# Patient Record
Sex: Male | Born: 1965 | Race: Black or African American | Hispanic: No | Marital: Married | Smoking: Never smoker
Health system: Southern US, Community
[De-identification: ages and names within clinical notes are randomized; demographics above are authoritative.]

## PROBLEM LIST (undated history)

## (undated) DIAGNOSIS — I10 Essential (primary) hypertension: Secondary | ICD-10-CM

---

## 2019-08-13 ENCOUNTER — Other Ambulatory Visit: Payer: Self-pay | Admitting: Sports Medicine

## 2019-08-13 DIAGNOSIS — R200 Anesthesia of skin: Secondary | ICD-10-CM

## 2019-08-13 DIAGNOSIS — M542 Cervicalgia: Secondary | ICD-10-CM

## 2019-08-13 DIAGNOSIS — M5030 Other cervical disc degeneration, unspecified cervical region: Secondary | ICD-10-CM

## 2019-08-18 ENCOUNTER — Other Ambulatory Visit: Payer: Self-pay

## 2019-08-18 ENCOUNTER — Ambulatory Visit
Admission: RE | Admit: 2019-08-18 | Discharge: 2019-08-18 | Disposition: A | Discharge status: Home / Self Care | Payer: Federal, State, Local not specified - PPO | Source: Ambulatory Visit | Attending: Sports Medicine | Admitting: Sports Medicine

## 2019-08-18 DIAGNOSIS — R200 Anesthesia of skin: Secondary | ICD-10-CM | POA: Diagnosis present

## 2019-08-18 DIAGNOSIS — R202 Paresthesia of skin: Secondary | ICD-10-CM | POA: Insufficient documentation

## 2019-08-18 DIAGNOSIS — M542 Cervicalgia: Secondary | ICD-10-CM | POA: Insufficient documentation

## 2019-08-18 DIAGNOSIS — M5030 Other cervical disc degeneration, unspecified cervical region: Secondary | ICD-10-CM | POA: Diagnosis present

## 2020-11-12 ENCOUNTER — Other Ambulatory Visit: Payer: Self-pay

## 2020-11-12 ENCOUNTER — Other Ambulatory Visit: Payer: Federal, State, Local not specified - PPO

## 2020-11-12 DIAGNOSIS — Z20822 Contact with and (suspected) exposure to covid-19: Secondary | ICD-10-CM

## 2020-11-13 LAB — SARS-COV-2, NAA 2 DAY TAT

## 2020-11-13 LAB — NOVEL CORONAVIRUS, NAA: SARS-CoV-2, NAA: NOT DETECTED

## 2021-06-03 IMAGING — MR MR CERVICAL SPINE W/O CM
5 series · 39 of 48 positions shown · non-contrast
Comparison: None.

CLINICAL DATA: Left-sided neck pain

EXAM:
MRI CERVICAL SPINE WITHOUT CONTRAST
TECHNIQUE: Multiplanar, multisequence MR imaging of the cervical spine was
performed. No intravenous contrast was administered.

[Series 5: T2 · sagittal · 3.0mm · 0.62mm/px · 6 of 15 slices shown (1 of 2)]
[im 1/15]
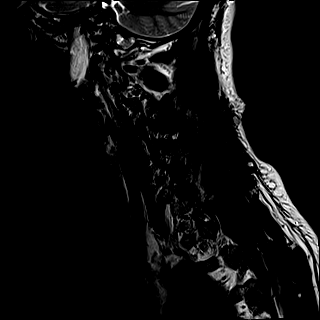
[im 3/15]
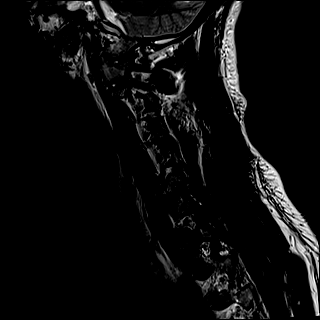
[im 6/15]
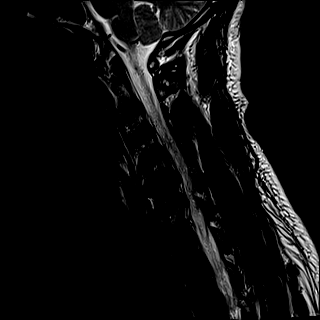
[im 9/15]
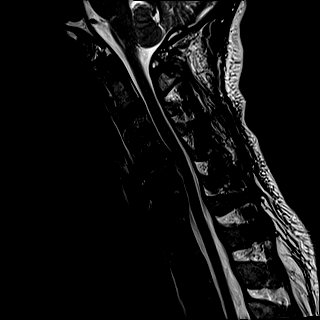
[im 12/15]
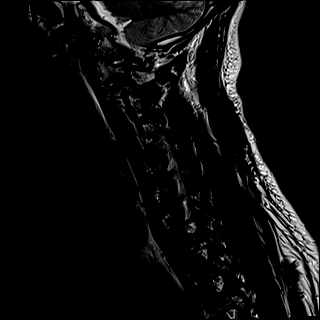
[im 15/15]
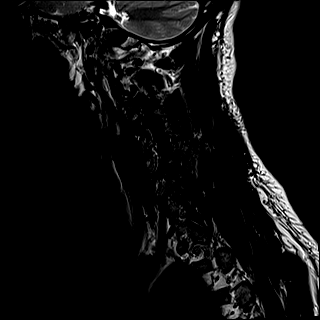

[Series 6: FLAIR · sagittal · 3.0mm · 0.78mm/px · 7 of 15 slices shown]
[im 1/15]
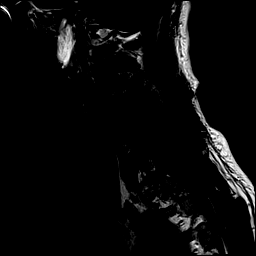
[im 3/15]
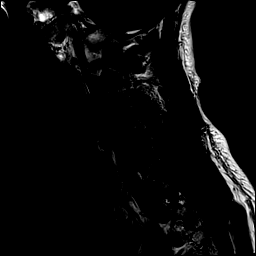
[im 5/15]
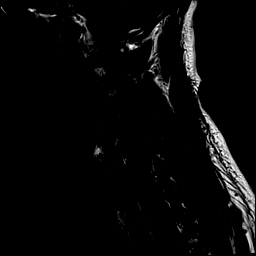
[im 8/15]
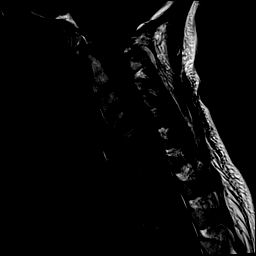
[im 10/15]
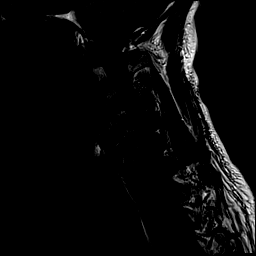
[im 12/15]
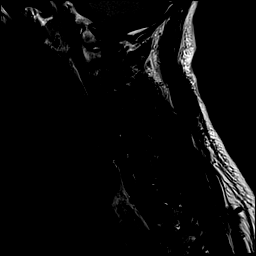
[im 15/15]
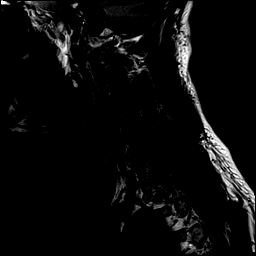

[Series 7: STIR · sagittal · 3.0mm · 0.62mm/px · 7 of 15 slices shown]
[im 1/15]
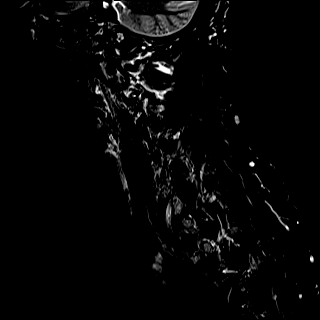
[im 3/15]
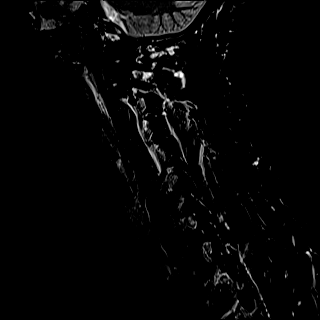
[im 5/15]
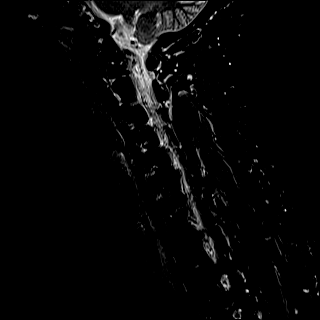
[im 8/15]
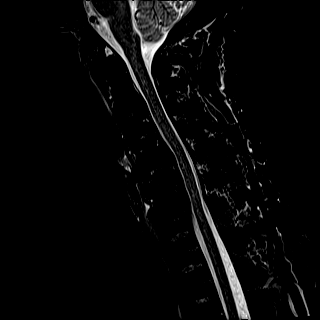
[im 10/15]
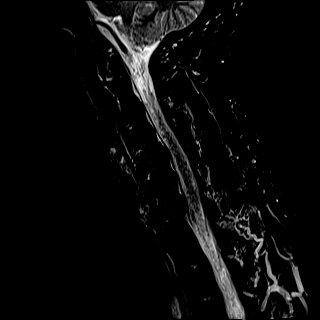
[im 12/15]
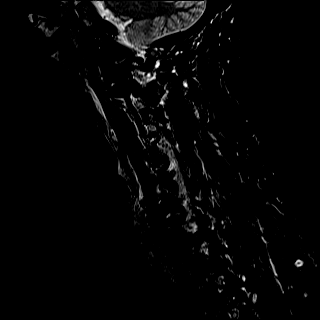
[im 15/15]
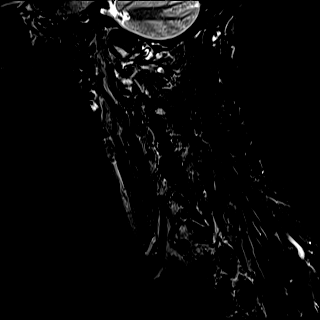

[Series 8: T2 · axial · 3.0mm · 0.70mm/px · z∈[-143,-51]mm · 11 of 29 slices shown (2 of 2)]
[im 1/29]
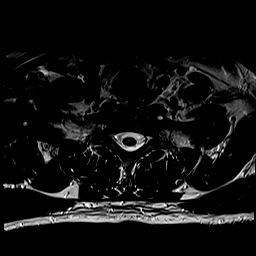
[im 3/29]
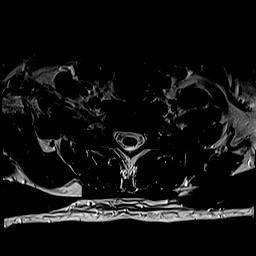
[im 5/29]
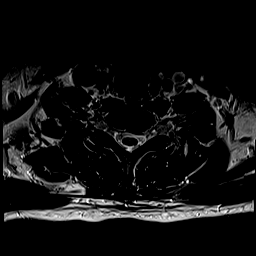
[im 7/29]
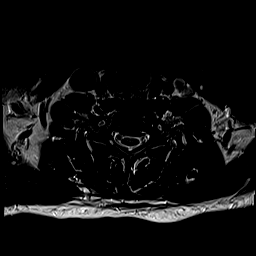
[im 9/29]
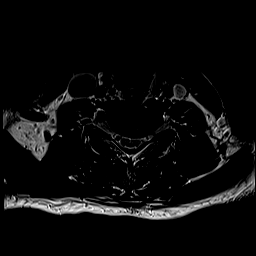
[im 11/29]
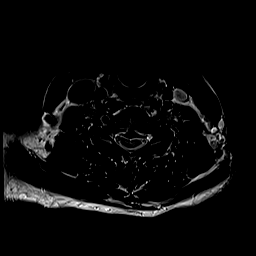
[im 13/29]
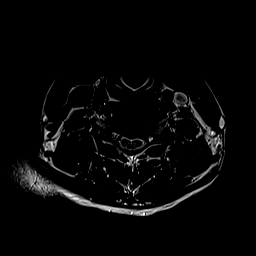
[im 16/29]
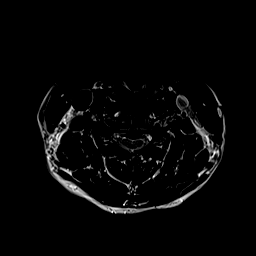
[im 20/29]
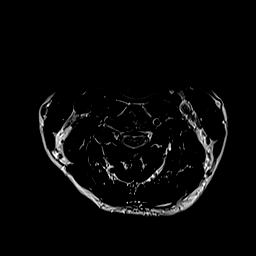
[im 24/29]
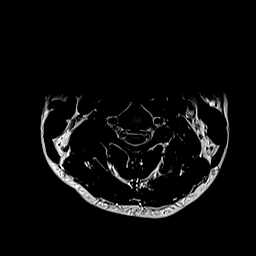
[im 29/29]
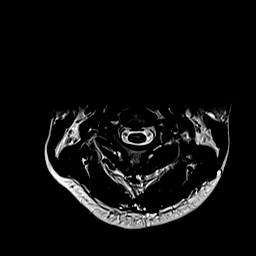

[Series 9: ax mpgr · axial · 3.0mm · 0.35mm/px · z∈[-143,-51]mm · 8 of 29 slices shown]
[im 1/29]
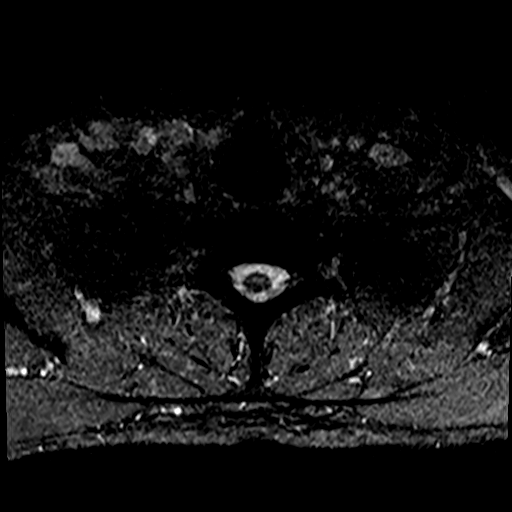
[im 5/29]
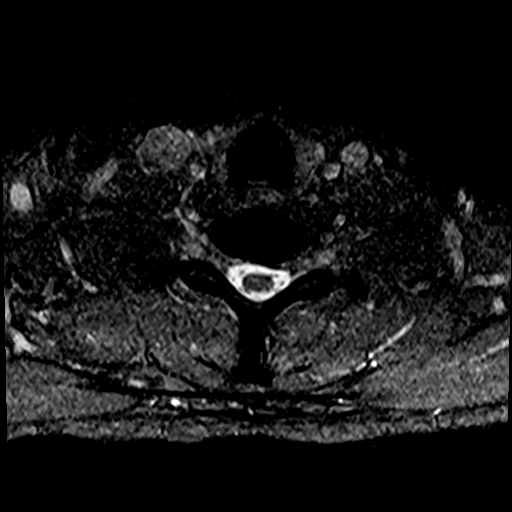
[im 9/29]
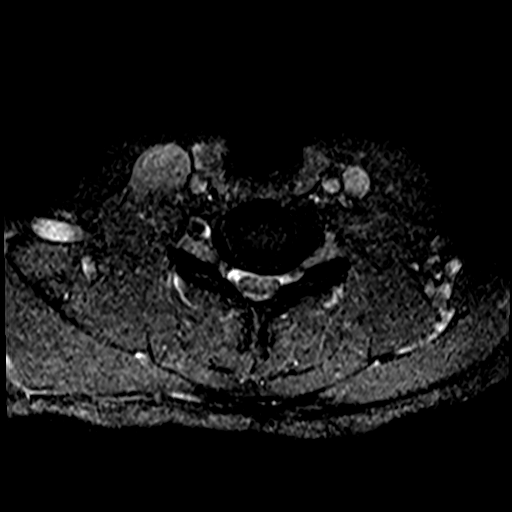
[im 13/29]
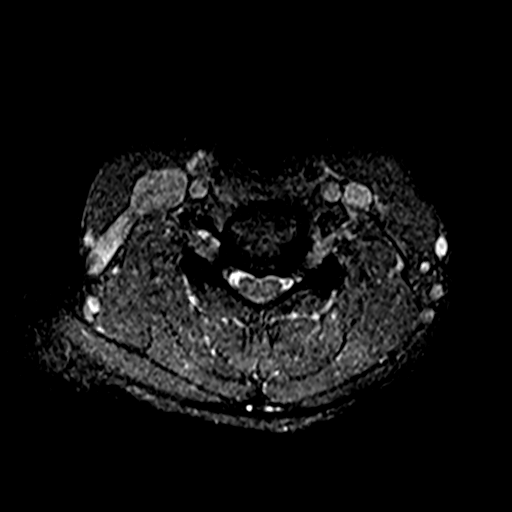
[im 16/29]
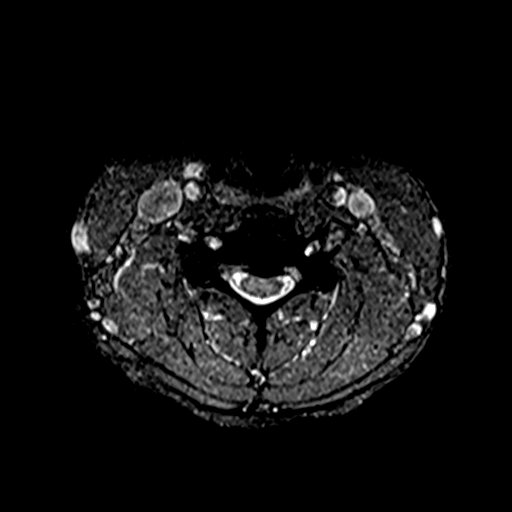
[im 20/29]
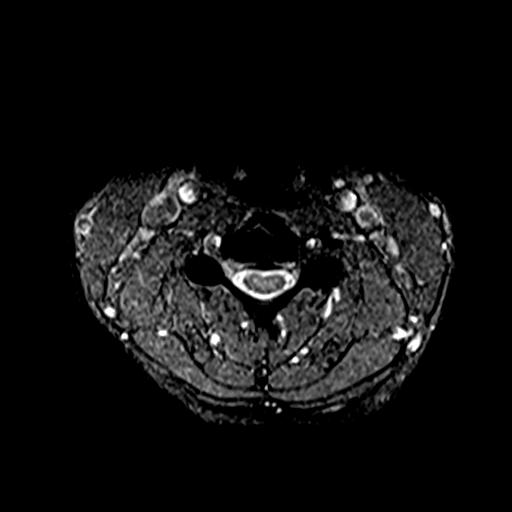
[im 24/29]
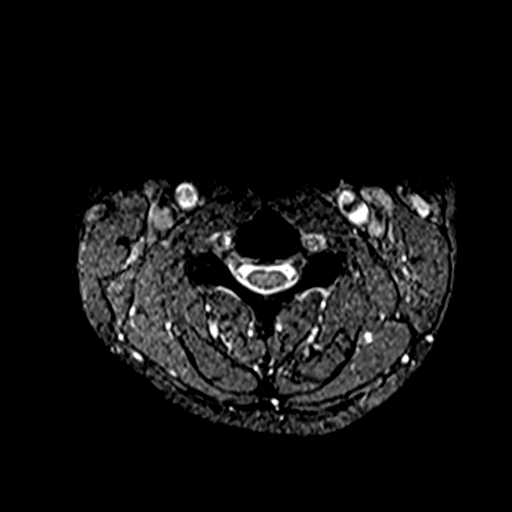
[im 29/29]
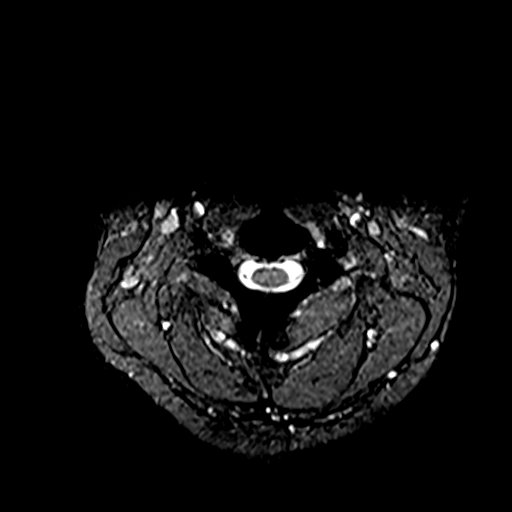

[39 of 48 positions shown; findings below may reference images not displayed]

FINDINGS: Alignment: Straightening of the cervical lordosis. No static
listhesis.

Vertebrae: No fracture, evidence of discitis, or bone lesion.
Multilevel discogenic endplate marrow changes and anterior endplate
spurring.

Cord: Normal signal and morphology.

Posterior Fossa, vertebral arteries, paraspinal tissues: Negative.

Disc levels:

C2-C3: Small posterior disc osteophyte complex and mild facet
arthrosis. There is mild impress upon the ventral thecal sac without
canal stenosis. Bilateral foramina are patent.

C3-C4: No significant disc protrusion, foraminal stenosis, or canal
stenosis.

C4-C5: Mild posterior disc osteophyte complex with mild bilateral
facet arthrosis. There is mild canal stenosis without significant
foraminal stenosis.

C5-C6: Posterior disc osteophyte complex and mild bilateral facet
and uncovertebral arthropathy resulting in mild canal stenosis and
mild left foraminal stenosis.

C6-C7: Posterior disc osteophyte complex and mild bilateral facet
arthrosis resulting in mild canal stenosis and mild left foraminal
stenosis.

C7-T1: No significant disc protrusion, foraminal stenosis, or canal
stenosis.
IMPRESSION: 1. Mild cervical spondylosis from C2 through C7 with mild canal
stenosis at C4-5 through C6-C7.
2. Mild left foraminal stenosis at C5-6 and C6-7.

## 2022-01-03 DIAGNOSIS — R079 Chest pain, unspecified: Secondary | ICD-10-CM | POA: Diagnosis not present

## 2022-01-03 DIAGNOSIS — R059 Cough, unspecified: Secondary | ICD-10-CM | POA: Diagnosis not present

## 2022-01-03 DIAGNOSIS — J40 Bronchitis, not specified as acute or chronic: Secondary | ICD-10-CM | POA: Diagnosis not present

## 2022-01-03 DIAGNOSIS — R0989 Other specified symptoms and signs involving the circulatory and respiratory systems: Secondary | ICD-10-CM | POA: Diagnosis not present

## 2022-01-07 ENCOUNTER — Other Ambulatory Visit: Payer: Self-pay

## 2022-01-07 ENCOUNTER — Encounter: Payer: Self-pay | Admitting: Nurse Practitioner

## 2022-01-07 ENCOUNTER — Ambulatory Visit: Payer: Federal, State, Local not specified - PPO | Admitting: Nurse Practitioner

## 2022-01-07 ENCOUNTER — Telehealth: Payer: Self-pay

## 2022-01-07 VITALS — BP 160/100 | HR 76 | Temp 98.5°F | Ht 68.25 in | Wt 172.4 lb

## 2022-01-07 DIAGNOSIS — N529 Male erectile dysfunction, unspecified: Secondary | ICD-10-CM

## 2022-01-07 DIAGNOSIS — M4802 Spinal stenosis, cervical region: Secondary | ICD-10-CM | POA: Insufficient documentation

## 2022-01-07 DIAGNOSIS — Z23 Encounter for immunization: Secondary | ICD-10-CM | POA: Diagnosis not present

## 2022-01-07 DIAGNOSIS — I10 Essential (primary) hypertension: Secondary | ICD-10-CM

## 2022-01-07 DIAGNOSIS — M25362 Other instability, left knee: Secondary | ICD-10-CM

## 2022-01-07 DIAGNOSIS — M19012 Primary osteoarthritis, left shoulder: Secondary | ICD-10-CM

## 2022-01-07 LAB — CBC
HCT: 38.9 % — ABNORMAL LOW (ref 39.0–52.0)
Hemoglobin: 12.8 g/dL — ABNORMAL LOW (ref 13.0–17.0)
MCHC: 33 g/dL (ref 30.0–36.0)
MCV: 82.7 fl (ref 78.0–100.0)
Platelets: 341 10*3/uL (ref 150.0–400.0)
RBC: 4.7 Mil/uL (ref 4.22–5.81)
RDW: 13.4 % (ref 11.5–15.5)
WBC: 19.4 10*3/uL (ref 4.0–10.5)

## 2022-01-07 LAB — BASIC METABOLIC PANEL
BUN: 14 mg/dL (ref 6–23)
CO2: 32 mEq/L (ref 19–32)
Calcium: 9.2 mg/dL (ref 8.4–10.5)
Chloride: 103 mEq/L (ref 96–112)
Creatinine, Ser: 0.99 mg/dL (ref 0.40–1.50)
GFR: 84.81 mL/min (ref >60.00)
Glucose, Bld: 88 mg/dL (ref 70–99)
Potassium: 3.8 mEq/L (ref 3.5–5.1)
Sodium: 140 mEq/L (ref 135–145)

## 2022-01-07 LAB — TSH: TSH: 0.41 u[IU]/mL (ref 0.35–5.50)

## 2022-01-07 LAB — PSA: PSA: 0.2 ng/mL (ref 0.10–4.00)

## 2022-01-07 MED ORDER — CYCLOBENZAPRINE HCL 5 MG PO TABS: 5.0000 mg | ORAL_TABLET | Freq: Every evening | ORAL | 5 refills | Status: DC | PRN

## 2022-01-07 MED ORDER — LOSARTAN POTASSIUM 25 MG PO TABS: 25.0000 mg | ORAL_TABLET | Freq: Every day | ORAL | 1 refills | Status: DC

## 2022-01-07 MED ORDER — MELOXICAM 7.5 MG PO TABS: 7.5000 mg | ORAL_TABLET | Freq: Every day | ORAL | 5 refills | Status: DC

## 2022-01-07 NOTE — Telephone Encounter (Signed)
[facility]:  Curtis Bowman 05/06/1966  ? ?CRITICAL VALUE: WBC  19.4   ? ?

## 2022-01-07 NOTE — Assessment & Plan Note (Addendum)
Chronic, onset several years ago, worsening, interferes with sleep. ?Left side radiates to left arm, has left arm weakness ?Denies any previous neck injury or surgery. ?Works as a [employer] certified accountant. ?Evaluate in past by [facility]. Treated with intramuscular injection and outpatient PT x 2weeks. No improvement. ?Reviewed previous cervical spine X-ray and MRI 2020: Mild cervical spondylosis from C2 through C7 with mild canal stenosis at C4-5 through C6-C7. Mild left foraminal stenosis at C5-6 and C6-7. ?Current use of Aleve 3tabs 3x/week with minimal improvement. ? ?Ref to ortho ?Start meloxicam and flexeril ?Advised about the risk of drowsiness with flexeril. He is to take med only at bedtime if needed ?

## 2022-01-07 NOTE — Progress Notes (Signed)
? ?             Established Patient Visit ? ?Patient: Curtis Bowman   DOB: July 21, 1966   56 y.o. Male  MRN: [id number] ?Visit Date: 01/07/2022 ? ?Subjective:  ?  ?Chief Complaint  ?Patient presents with  ? Establish Care  ?  Pt would like to discuss ED and neck pain also.  ?Flu and shingles vaccine given today. ?Tdap done within the past 10 years patient will get the date.   ? ?HPI ?Cervical stenosis of spinal canal ?Chronic, onset several years ago, worsening, interferes with sleep. ?Left side radiates to left arm, has left arm weakness ?Denies any previous neck injury or surgery. ?Works as a Chartered certified accountant. ?Evaluate in past by [facility]. Treated with intramuscular injection and outpatient PT x 2weeks. No improvement. ?Reviewed previous cervical spine X-ray and MRI 2020: Mild cervical spondylosis from C2 through C7 with mild canal stenosis at C4-5 through C6-C7. Mild left foraminal stenosis at C5-6 and C6-7. ?Current use of Aleve 3tabs 3x/week with minimal improvement. ? ?Ref to ortho ?Start meloxicam and flexeril ?Advised about the risk of drowsiness with flexeril. He is to take med only at bedtime if needed ? ?Knee instability, left ?Knee injury at age 65: dislocated joint. ?experience intermittent swelling and joint instability with sudden movement, and prolonged standing or walking. ?Minimal improvement with knee sleeve. ? ?Start meloxicam ?Use knee sleeve daily, off at bedtime ?Ref to ortho ? ?Erectile dysfunction ?Onset 29yrs ago, erections are not as strong and do not last. ?Nocturia 2x/night. No frequency or hesitancy or hematuria or dysuria ?No Fhx of  BPH or prostate cancer ?No personal hx of DM ?No tobacco use ?ETOH and marijuana use daily ? previous use of Viagra (caused headache) and cialis (effective with no adverse side effects) ? ?Elevated BP today: improve BP management before resuming cialis ?Advised to decrease ETOH use to 1beer per day and quit marijuana use ?Check cbc, TSH, BMP,  testosterone and PSA. ? ? ?Primary hypertension ?Noted in 2020 during Ortho visit ?Check BP twice today and still elevate. ?No tobacco use ?ETOH and marijuana use daily ?BP Readings from Last 3 Encounters:  ?01/07/22 (!) 160/100  ? ?Start losartan 25mg  ?Check cbc, tsh and bmp. ?Advised about the complications of uncontrolled HTN, including ED. Also advised about the correlation with ETOH and marijuana use.  ?Provided information on DASH diet ?F/up in 71month ? ?Reviewed medical, surgical, and social history today ? ?Medications: ?Outpatient Medications Prior to Visit  ?Medication Sig  ? doxycycline (VIBRA-TABS) 100 MG tablet Take by mouth.  ? predniSONE (STERAPRED UNI-PAK 21 TAB) 10 MG (21) TBPK tablet Take by mouth.  ? promethazine-dextromethorphan (PROMETHAZINE-DM) 6.25-15 MG/5ML syrup Take 5 mLs by mouth every 6 (six) hours as needed.  ? [DISCONTINUED] methocarbamol (ROBAXIN) 500 MG tablet Take by mouth.  ? ?No facility-administered medications prior to visit.  ? ?Reviewed past medical and social history.  ? ?ROS per HPI above ? ? ?   ?Objective:  ?BP (!) 160/100 (BP Location: Left Arm, Patient Position: Supine)   Pulse 76   Temp 98.5 ?F (36.9 ?C) (Temporal)   Ht 5' 8.25" (1.734 m)   Wt 172 lb 6.4 oz (78.2 kg)   SpO2 97%   BMI 26.02 kg/m?  ? ?  ? ?Physical Exam ?Vitals reviewed.  ?Neck:  ?   Thyroid: No thyroid mass, thyromegaly or thyroid tenderness.  ?Cardiovascular:  ?   Rate and Rhythm: Normal rate and regular rhythm.  ?  Pulses: Normal pulses.  ?   Heart sounds: Normal heart sounds.  ?Pulmonary:  ?   Effort: Pulmonary effort is normal.  ?   Breath sounds: Normal breath sounds.  ?Musculoskeletal:     ?   General: Tenderness present. Normal range of motion.  ?   Right shoulder: Normal.  ?   Left shoulder: Tenderness, bony tenderness and crepitus present. No swelling, deformity, effusion or laceration. Normal range of motion. Decreased strength. Normal pulse.  ?   Right upper arm: Normal.  ?   Left upper  arm: Normal.  ?   Right elbow: Normal.  ?   Left elbow: Normal.  ?   Right forearm: Normal.  ?   Left forearm: Normal.  ?   Right hand: Normal. Normal pulse.  ?   Left hand: Decreased strength. Normal pulse.  ?   Cervical back: No spasms, torticollis, tenderness, bony tenderness or crepitus. Pain with movement present. Normal range of motion.  ?   Right lower leg: No edema.  ?   Left lower leg: No edema.  ?Lymphadenopathy:  ?   Cervical: No cervical adenopathy.  ?Neurological:  ?   Mental Status: He is alert.  ?  ?No results found for any visits on 01/07/22. ?   ?Assessment & Plan:  ?  ?Problem List Items Addressed This Visit   ? ?  ? Cardiovascular and Mediastinum  ? Primary hypertension - Primary  ?  Noted in 2020 during Ortho visit ?Check BP twice today and still elevate. ?No tobacco use ?ETOH and marijuana use daily ?BP Readings from Last 3 Encounters:  ?01/07/22 (!) 160/100  ? ?Start losartan 25mg  ?Check cbc, tsh and bmp. ?Advised about the complications of uncontrolled HTN, including ED. Also advised about the correlation with ETOH and marijuana use.  ?Provided information on DASH diet ?F/up in 47month ?  ?  ? Relevant Medications  ? losartan (COZAAR) 25 MG tablet  ? Other Relevant Orders  ? CBC  ? Basic metabolic panel  ? TSH  ?  ? Musculoskeletal and Integument  ? Primary osteoarthritis of left shoulder  ? Relevant Medications  ? predniSONE (STERAPRED UNI-PAK 21 TAB) 10 MG (21) TBPK tablet  ? meloxicam (MOBIC) 7.5 MG tablet  ? cyclobenzaprine (FLEXERIL) 5 MG tablet  ? Other Relevant Orders  ? Ambulatory referral to Physical Therapy  ? AMB referral to orthopedics  ?  ? Other  ? Cervical stenosis of spinal canal  ?  Chronic, onset several years ago, worsening, interferes with sleep. ?Left side radiates to left arm, has left arm weakness ?Denies any previous neck injury or surgery. ?Works as a Chartered certified accountantmachinist. ?[facility]. Treated with intramuscular injection and outpatient PT x  2weeks. No improvement. ?Reviewed previous cervical spine X-ray and MRI 2020: Mild cervical spondylosis from C2 through C7 with mild canal stenosis at C4-5 through C6-C7. Mild left foraminal stenosis at C5-6 and C6-7. ?Current use of Aleve 3tabs 3x/week with minimal improvement. ? ?Ref to ortho ?Start meloxicam and flexeril ?Advised about the risk of drowsiness with flexeril. He is to take med only at bedtime if needed ?  ?  ? Relevant Medications  ? meloxicam (MOBIC) 7.5 MG tablet  ? cyclobenzaprine (FLEXERIL) 5 MG tablet  ? Other Relevant Orders  ? Ambulatory referral to Physical Therapy  ? AMB referral to orthopedics  ? Erectile dysfunction  ?  Onset 3867yrs ago, erections are not as strong and do not last. ?Nocturia  2x/night. No frequency or hesitancy or hematuria or dysuria ?No Fhx of  BPH or prostate cancer ?No personal hx of DM ?No tobacco use ?ETOH and marijuana use daily ? previous use of Viagra (caused headache) and cialis (effective with no adverse side effects) ? ?Elevated BP today: improve BP management before resuming cialis ?Advised to decrease ETOH use to 1beer per day and quit marijuana use ?Check cbc, TSH, BMP, testosterone and PSA. ? ?  ?  ? Relevant Orders  ? CBC  ? Basic metabolic panel  ? TSH  ? PSA  ? Testosterone,Free and Total  ? Knee instability, left  ?  Knee injury at age 35: dislocated joint. ?experience intermittent swelling and joint instability with sudden movement, and prolonged standing or walking. ?Minimal improvement with knee sleeve. ? ?Start meloxicam ?Use knee sleeve daily, off at bedtime ?Ref to ortho ?  ?  ? Relevant Orders  ? AMB referral to orthopedics  ? ?Other Visit Diagnoses   ? ? Flu vaccine need      ? Relevant Orders  ? Flu Vaccine QUAD 6+ mos PF IM (Fluarix Quad PF) (Completed)  ? Need for shingles vaccine      ? Relevant Orders  ? Varicella-zoster vaccine IM (Completed)  ? ?  ? ?I have spent with this patient regarding history taking, documentation, review of  radiology, specialty note, formulating plan and discussing treatment options with patient.  ? ?Return in about 4 weeks (around 02/04/2022) for HTN. ? ?  ? ?Alysia Penna, NP ? ? ? ?

## 2022-01-07 NOTE — Assessment & Plan Note (Addendum)
Noted in 2020 during Ortho visit ?Check BP twice today and still elevate. ?No tobacco use ?ETOH and marijuana use daily ?BP Readings from Last 3 Encounters:  ?01/07/22 (!) 160/100  ? ?Start losartan 25mg  ?Check cbc, tsh and bmp. ?Advised about the complications of uncontrolled HTN, including ED. Also advised about the correlation with ETOH and marijuana use.  ?Provided information on DASH diet ?F/up in 85month ?

## 2022-01-07 NOTE — Patient Instructions (Addendum)
Thank you for choosing [facility] ? ?Start losartan, mobic, and flexeril as prescribed ? ?You will be contacted to schedule appt with ortho and for PT. ? ?Go to lab for blood draw. ? ?Hypertension, Adult ?Hypertension is another name for high blood pressure. High blood pressure forces your heart to work harder to pump blood. This can cause problems over time. ?There are two numbers in a blood pressure reading. There is a top number (systolic) over a bottom number (diastolic). It is best to have a blood pressure that is below 120/80. Healthy choices can help lower your blood pressure, or you may need medicine to help lower it. ?What are the causes? ?The cause of this condition is not known. Some conditions may be related to high blood pressure. ?What increases the risk? ?Smoking. ?Having type 2 diabetes mellitus, high cholesterol, or both. ?Not getting enough exercise or physical activity. ?Being overweight. ?Having too much fat, sugar, calories, or salt (sodium) in your diet. ?Drinking too much alcohol. ?Having long-term (chronic) kidney disease. ?Having a family history of high blood pressure. ?Age. Risk increases with age. ?Race. You may be at higher risk if you are African American. ?Gender. Men are at higher risk than women before age 58. After age 18, women are at higher risk than men. ?Having obstructive sleep apnea. ?Stress. ?What are the signs or symptoms? ?High blood pressure may not cause symptoms. Very high blood pressure (hypertensive crisis) may cause: ?Headache. ?Feelings of worry or nervousness (anxiety). ?Shortness of breath. ?Nosebleed. ?A feeling of being sick to your stomach (nausea). ?Throwing up (vomiting). ?Changes in how you see. ?Very bad chest pain. ?Seizures. ?How is this treated? ?This condition is treated by making healthy lifestyle changes, such as: ?Eating healthy foods. ?Exercising more. ?Drinking less alcohol. ?Your health care provider may prescribe medicine if lifestyle  changes are not enough to get your blood pressure under control, and if: ?Your top number is above 130. ?Your bottom number is above 80. ?Your personal target blood pressure may vary. ?Follow these instructions at home: ?Eating and drinking ? ?If told, follow the DASH eating plan. To follow this plan: ?Fill one half of your plate at each meal with fruits and vegetables. ?Fill one fourth of your plate at each meal with whole grains. Whole grains include whole-wheat pasta, brown rice, and whole-grain bread. ?Eat or drink low-fat dairy products, such as skim milk or low-fat yogurt. ?Fill one fourth of your plate at each meal with low-fat (lean) proteins. Low-fat proteins include fish, chicken without skin, eggs, beans, and tofu. ?Avoid fatty meat, cured and processed meat, or chicken with skin. ?Avoid pre-made or processed food. ?Eat less than 1,500 mg of salt each day. ?Do not drink alcohol if: ?Your doctor tells you not to drink. ?You are pregnant, may be pregnant, or are planning to become pregnant. ?If you drink alcohol: ?Limit how much you use to: ?0-1 drink a day for women. ?0-2 drinks a day for men. ?Be aware of how much alcohol is in your drink. In the [place] one drink equals one 12 oz bottle of beer (355 mL), one 5 oz glass of wine (148 mL), or one 1? oz glass of hard liquor (44 mL). ?Lifestyle ? ?Work with your doctor to stay at a healthy weight or to lose weight. Ask your doctor what the best weight is for you. ?Get at least 30 minutes of exercise most days of the week. This may include walking, swimming, or biking. ?Get at  least 30 minutes of exercise that strengthens your muscles (resistance exercise) at least 3 days a week. This may include lifting weights or doing Pilates. ?Do not use any products that contain nicotine or tobacco, such as cigarettes, e-cigarettes, and chewing tobacco. If you need help quitting, ask your doctor. ?Check your blood pressure at home as told by your doctor. ?Keep all follow-up  visits as told by your doctor. This is important. ?Medicines ?Take over-the-counter and prescription medicines only as told by your doctor. Follow directions carefully. ?Do not skip doses of blood pressure medicine. The medicine does not work as well if you skip doses. Skipping doses also puts you at risk for problems. ?Ask your doctor about side effects or reactions to medicines that you should watch for. ?Contact a doctor if you: ?Think you are having a reaction to the medicine you are taking. ?Have headaches that keep coming back (recurring). ?Feel dizzy. ?Have swelling in your ankles. ?Have trouble with your vision. ?Get help right away if you: ?Get a very bad headache. ?Start to feel mixed up (confused). ?Feel weak or numb. ?Feel faint. ?Have very bad pain in your: ?Chest. ?Belly (abdomen). ?Throw up more than once. ?Have trouble breathing. ?Summary ?Hypertension is another name for high blood pressure. ?High blood pressure forces your heart to work harder to pump blood. ?For most people, a normal blood pressure is less than 120/80. ?Making healthy choices can help lower blood pressure. If your blood pressure does not get lower with healthy choices, you may need to take medicine. ?This information is not intended to replace advice given to you by your health care provider. Make sure you discuss any questions you have with your health care provider. ?Document Revised: 06/20/2018 Document Reviewed: 06/20/2018 ?Elsevier Patient Education ? [employer]. ? ? ?Cooking With Less Salt ?Cooking with less salt is one way to reduce the amount of sodium you get from food. Sodium is one of the elements that make up salt. It is found naturally in foods and is also added to certain foods. Depending on your condition and overall health, your health care provider or dietitian may recommend that you reduce your sodium intake. Most people should have less than 2,300 milligrams (mg) of sodium each day. If you have high blood  pressure (hypertension), you may need to limit your sodium to 1,500 mg each day. Follow the tips below to help reduce your sodium intake. ?What are tips for eating less sodium? ?Reading food labels ? ?Check the food label before buying or using packaged ingredients. Always check the label for the serving size and sodium content. ?Look for products with no more than 140 mg of sodium in one serving. ?Check the % Daily Value column to see what percent of the daily recommended amount of sodium is provided in one serving of the product. Foods with 5% or less in this column are considered low in sodium. Foods with 20% or higher are considered high in sodium. ?Do not choose foods with salt as one of the first three ingredients on the ingredients list. If salt is one of the first three ingredients, it usually means the item is high in sodium. ?Shopping ?Buy sodium-free or low-sodium products. Look for the following words on food labels: ?Low-sodium. ?Sodium-free. ?Reduced-sodium. ?No salt added. ?Unsalted. ?Always check the sodium content even if foods are labeled as low-sodium or no salt added. ?Buy fresh foods. ?Cooking ?Use herbs, seasonings without salt, and spices as substitutes for salt. ?Use sodium-free  baking soda when baking. ?Grill, braise, or roast foods to add flavor with less salt. ?Avoid adding salt to pasta, rice, or hot cereals. ?Drain and rinse canned vegetables, beans, and meat before use. ?Avoid adding salt when cooking sweets and desserts. ?Cook with low-sodium ingredients. ?What foods are high in sodium? ?Vegetables ?Regular canned vegetables (not low-sodium or reduced-sodium). Sauerkraut, pickled vegetables, and relishes. Olives. [place] fries. Onion rings. Regular canned tomato sauce and paste. Regular tomato and vegetable juice. Frozen vegetables in sauces. ?Grains ?Instant hot cereals. Bread stuffing, pancake, and biscuit mixes. Croutons. Seasoned rice or pasta mixes. Noodle soup cups. Boxed or  frozen macaroni and cheese. Regular salted crackers. Self-rising flour. Rolls. Bagels. Flour tortillas and wraps. ?Meats and other proteins ?Meat or fish that is salted, canned, smoked, cured, spiced, or p

## 2022-01-07 NOTE — Assessment & Plan Note (Addendum)
Onset 76yrs ago, erections are not as strong and do not last. ?Nocturia 2x/night. No frequency or hesitancy or hematuria or dysuria ?No Fhx of  BPH or prostate cancer ?No personal hx of DM ?No tobacco use ?ETOH and marijuana use daily ? previous use of Viagra (caused headache) and cialis (effective with no adverse side effects) ? ?Elevated BP today: improve BP management before resuming cialis ?Advised to decrease ETOH use to 1beer per day and quit marijuana use ?Check cbc, TSH, BMP, testosterone and PSA. ? ?

## 2022-01-07 NOTE — Assessment & Plan Note (Signed)
Knee injury at age 56: dislocated joint. ?experience intermittent swelling and joint instability with sudden movement, and prolonged standing or walking. ?Minimal improvement with knee sleeve. ? ?Start meloxicam ?Use knee sleeve daily, off at bedtime ?Ref to ortho ?

## 2022-01-14 LAB — TESTOSTERONE,FREE AND TOTAL
Testosterone, Free: 2.9 pg/mL — ABNORMAL LOW (ref 7.2–24.0)
Testosterone: 470 ng/dL (ref 264–916)

## 2022-01-20 ENCOUNTER — Ambulatory Visit: Payer: Federal, State, Local not specified - PPO | Admitting: Orthopaedic Surgery

## 2022-01-26 ENCOUNTER — Encounter: Payer: Self-pay | Admitting: Orthopaedic Surgery

## 2022-01-26 ENCOUNTER — Ambulatory Visit (INDEPENDENT_AMBULATORY_CARE_PROVIDER_SITE_OTHER): Payer: Federal, State, Local not specified - PPO

## 2022-01-26 ENCOUNTER — Ambulatory Visit: Payer: Federal, State, Local not specified - PPO | Admitting: Orthopaedic Surgery

## 2022-01-26 DIAGNOSIS — M4802 Spinal stenosis, cervical region: Secondary | ICD-10-CM

## 2022-01-26 DIAGNOSIS — M1712 Unilateral primary osteoarthritis, left knee: Secondary | ICD-10-CM

## 2022-01-26 MED ORDER — TRAMADOL HCL 50 MG PO TABS: 50.0000 mg | ORAL_TABLET | Freq: Two times a day (BID) | ORAL | 2 refills | Status: DC | PRN

## 2022-01-26 NOTE — Progress Notes (Signed)
? ?Office Visit Note ?  ?Patient: Curtis Bowman           ?Date of Birth: 21-Nov-1965           ?MRN: [id number] ?Visit Date: 01/26/2022 ?             ?Requested by: Anne Ng, NP ?[phone] ?[place],  [place] [address] ?PCP: Anne Ng, NP ? ? ?Assessment & Plan: ?Visit Diagnoses:  ?1. Cervical stenosis of spinal canal   ?2. Primary osteoarthritis of left knee   ? ? ?Plan: Impression is cervical spine radiculopathy left upper extremity and chronic left knee pain concerning for medial meniscal pathology.  In regards to the neck, I would like to refer him to Dr. Alvester Morin for epidural steroid injection.  In regards to the knee, proceeded with left knee cortisone injection today.  If his symptoms do not improve he will follow-up for recheck.  Call with concerns or questions. ? ?Follow-Up Instructions: Return if symptoms worsen or fail to improve.  ? ?Orders:  ?Orders Placed This Encounter  ?Procedures  ? XR Cervical Spine 2 or 3 views  ? XR KNEE 3 VIEW LEFT  ? ?Meds ordered this encounter  ?Medications  ? traMADol (ULTRAM) 50 MG tablet  ?  Sig: Take 1 tablet (50 mg total) by mouth every 12 (twelve) hours as needed.  ?  Dispense:  30 tablet  ?  Refill:  2  ? ? ? ? Procedures: ?No procedures performed ? ? ?Clinical Data: ?No additional findings. ? ? ?Subjective: ?Chief Complaint  ?Patient presents with  ? Left Shoulder - Pain  ? Neck - Pain  ? Left Knee - Pain  ? ? ?HPI patient is a pleasant 56 year old gentleman who comes in today with left-sided neck and arm pain in addition to left knee pain.  He notes that his left neck/arm pain began back in 2005 after sleeping awkwardly.  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  The pain he has is constant.  It is located to the lateral neck and radiates down the entire left arm and into his fingers.  He notes occasional paresthesias to the left hand.  Symptoms do appear to be worse with neck extension,  flexion as well as when he is lifting his left arm.  He recently been taking meloxicam with minimal relief.  No previous ESI.  He has been to physical therapy without relief.  Regards to his left knee, he has a history of what sounds like a valgus injury nearly 40 years ago.  He was seen by an orthopedist who recommended surgery at that time.  He did not proceed with this due to financial constraints.  He really has not been bothered by this until recently.  No new injury.  The pain he has is to the entire knee with associated stiffness and swelling.  Symptoms are worse with stair climbing as well as at the end of the day.  Meloxicam does not significantly help.  No previous cortisone injection or surgical intervention. ? ?Review of Systems as detailed in HPI.  All others reviewed and are negative. ? ? ?Objective: ?Vital Signs: There were no vitals taken for this visit. ? ?Physical Exam well-developed well-nourished gentleman in no acute distress.  Alert and oriented x3. ? ?Ortho Exam cervical spine exam reveals increased pain with extension.  He does not have spinous or paraspinous tenderness.  Left shoulder  exam is unremarkable.  Left knee exam shows range of motion from 0 to 120 degrees.  No effusion.  He is stable to valgus and varus stress.  He does have moderate medial joint line tenderness.  Neurovascular intact distally. ? ?Specialty Comments:  ?No specialty comments available. ? ?Imaging: ?XR KNEE 3 VIEW LEFT ? ?Result Date: 01/26/2022 ?X-rays demonstrate moderate degenerative changes the medial compartment.  Does have patella Baha.  ? ? ?PMFS History: ?Patient Active Problem List  ? Diagnosis Date Noted  ? Erectile dysfunction 01/07/2022  ? Cervical stenosis of spinal canal 01/07/2022  ? Primary hypertension 01/07/2022  ? Knee instability, left 01/07/2022  ? Primary osteoarthritis of left shoulder 01/07/2022  ? ?History reviewed. No pertinent past medical history.  ?Family History  ?Problem Relation Age of  Onset  ? Diabetes Mother   ? Hypertension Mother   ? Congestive Heart Failure Mother   ? Hypertension Father   ? Hypertension Sister   ? Diabetes Sister   ? Hypertension Brother   ? Diabetes Brother   ? Stroke Paternal Uncle   ? Hypertension Paternal Uncle   ? Heart disease Paternal Grandfather   ?  ?History reviewed. No pertinent surgical history. ?Social History  ? ?Occupational History  ? Not on file  ?Tobacco Use  ? Smoking status: Never  ? Smokeless tobacco: Never  ?Vaping Use  ? Vaping Use: Never used  ?Substance and Sexual Activity  ? Alcohol use: Yes  ?  Comment: moderate  ? Drug use: Yes  ?  Frequency: 7.0 times per week  ?  Types: Marijuana  ? Sexual activity: Yes  ? ? ? ? ? ? ?

## 2022-01-27 ENCOUNTER — Other Ambulatory Visit: Payer: Self-pay

## 2022-01-27 DIAGNOSIS — M4802 Spinal stenosis, cervical region: Secondary | ICD-10-CM

## 2022-02-11 ENCOUNTER — Ambulatory Visit: Payer: Federal, State, Local not specified - PPO | Admitting: Nurse Practitioner

## 2022-02-11 ENCOUNTER — Telehealth: Payer: Self-pay | Admitting: Nurse Practitioner

## 2022-02-11 NOTE — Telephone Encounter (Signed)
no show letter mailed 4/21 ym  ?

## 2022-03-01 NOTE — Telephone Encounter (Signed)
1st no show, fee waived ?

## 2022-03-14 ENCOUNTER — Encounter: Payer: Federal, State, Local not specified - PPO | Admitting: Physical Medicine and Rehabilitation

## 2023-01-10 ENCOUNTER — Ambulatory Visit: Payer: Federal, State, Local not specified - PPO | Admitting: Nurse Practitioner

## 2023-01-10 ENCOUNTER — Encounter: Payer: Self-pay | Admitting: Nurse Practitioner

## 2023-01-10 VITALS — BP 140/100 | HR 80 | Temp 98.0°F | Resp 16 | Ht 69.5 in | Wt 197.0 lb

## 2023-01-10 DIAGNOSIS — N528 Other male erectile dysfunction: Secondary | ICD-10-CM

## 2023-01-10 DIAGNOSIS — Z13220 Encounter for screening for lipoid disorders: Secondary | ICD-10-CM

## 2023-01-10 DIAGNOSIS — M1712 Unilateral primary osteoarthritis, left knee: Secondary | ICD-10-CM | POA: Diagnosis not present

## 2023-01-10 DIAGNOSIS — I10 Essential (primary) hypertension: Secondary | ICD-10-CM

## 2023-01-10 DIAGNOSIS — M4802 Spinal stenosis, cervical region: Secondary | ICD-10-CM

## 2023-01-10 DIAGNOSIS — R351 Nocturia: Secondary | ICD-10-CM

## 2023-01-10 DIAGNOSIS — Z136 Encounter for screening for cardiovascular disorders: Secondary | ICD-10-CM

## 2023-01-10 DIAGNOSIS — M19012 Primary osteoarthritis, left shoulder: Secondary | ICD-10-CM | POA: Diagnosis not present

## 2023-01-10 MED ORDER — MELOXICAM 7.5 MG PO TABS: 7.5000 mg | ORAL_TABLET | Freq: Every day | ORAL | 5 refills | Status: DC

## 2023-01-10 MED ORDER — CYCLOBENZAPRINE HCL 5 MG PO TABS: 5.0000 mg | ORAL_TABLET | Freq: Every evening | ORAL | 0 refills | Status: DC | PRN

## 2023-01-10 MED ORDER — LOSARTAN POTASSIUM 25 MG PO TABS: 25.0000 mg | ORAL_TABLET | Freq: Every day | ORAL | 1 refills | Status: DC

## 2023-01-10 NOTE — Assessment & Plan Note (Signed)
Chronic, associated with joint instability Advised to use knee brace Resume mobic and muscle relaxant F/up with ortho

## 2023-01-10 NOTE — Assessment & Plan Note (Signed)
BP not at goal due to med and diet non compliance. BP Readings from Last 3 Encounters:  01/10/23 (!) 140/100  01/07/22 (!) 160/100    Advised about possible adverse effects of uncontrolled BP, including its correlation with ED; need for DASH diet and minimize ETOH consumption. Resume losartan F/up in 33month

## 2023-01-10 NOTE — Assessment & Plan Note (Signed)
Chronic No change Resume mobic and muscle relaxant F/up with ortho

## 2023-01-10 NOTE — Progress Notes (Signed)
Established Patient Visit  Patient: Curtis Bowman   DOB: 11-05-1965   57 y.o. Male  MRN: [id number] Visit Date: 01/10/2023  Subjective:    Chief Complaint  Patient presents with   Pain    Left Neck, shoulder and left knee pain    HPI Erectile dysfunction He has AM erection and normal libido, but unable to sustain and erection. No illicit drug use, no tobacco use ETOH use: 4beers daily with unisom sleep aid (3tabs) Uncontrolled BP.  Repeat prolactin, testosterone, TSH, CBC, A1c, CMP, PSA. Advised to quit ETOH use or minimize to 1drink daily Advised to avoid use of sleep aid with ETOH. Send cialis if normal labs  Primary osteoarthritis of left shoulder Chronic No change Resume mobic and muscle relaxant F/up with ortho  Cervical stenosis of spinal canal Chronic No change Resume mobic and muscle relaxant F/up with ortho  Arthritis of knee, left Chronic, associated with joint instability Advised to use knee brace Resume mobic and muscle relaxant F/up with ortho  Primary hypertension BP not at goal due to med and diet non compliance. BP Readings from Last 3 Encounters:  01/10/23 (!) 140/100  01/07/22 (!) 160/100    Advised about possible adverse effects of uncontrolled BP, including its correlation with ED; need for DASH diet and minimize ETOH consumption. Resume losartan F/up in 67month  ETOH: 4-16oz beer daily.  AM erection Unable to sustain erection with intercourse  Reviewed medical, surgical, and social history today  Medications: Outpatient Medications Prior to Visit  Medication Sig   [DISCONTINUED] cyclobenzaprine (FLEXERIL) 5 MG tablet Take 1-2 tablets (5-10 mg total) by mouth at bedtime as needed for muscle spasms. (Patient not taking: Reported on 01/10/2023)   [DISCONTINUED] losartan (COZAAR) 25 MG tablet Take 1 tablet (25 mg total) by mouth daily. (Patient not taking: Reported on 01/10/2023)   [DISCONTINUED] meloxicam (MOBIC) 7.5 MG  tablet Take 1 tablet (7.5 mg total) by mouth daily. With food (Patient not taking: Reported on 01/10/2023)   [DISCONTINUED] predniSONE (STERAPRED UNI-PAK 21 TAB) 10 MG (21) TBPK tablet Take by mouth. (Patient not taking: Reported on 01/10/2023)   [DISCONTINUED] promethazine-dextromethorphan (PROMETHAZINE-DM) 6.25-15 MG/5ML syrup Take 5 mLs by mouth every 6 (six) hours as needed. (Patient not taking: Reported on 01/10/2023)   [DISCONTINUED] traMADol (ULTRAM) 50 MG tablet Take 1 tablet (50 mg total) by mouth every 12 (twelve) hours as needed. (Patient not taking: Reported on 01/10/2023)   No facility-administered medications prior to visit.   Reviewed past medical and social history.   ROS per HPI above      Objective:  BP (!) 140/100   Pulse 80   Temp 98 F (36.7 C) (Temporal)   Resp 16   Ht 5' 9.5" (1.765 m)   Wt 197 lb (89.4 kg)   SpO2 99%   BMI 28.67 kg/m      Physical Exam Vitals reviewed.  Cardiovascular:     Rate and Rhythm: Normal rate and regular rhythm.     Pulses: Normal pulses.     Heart sounds: Normal heart sounds.  Pulmonary:     Effort: Pulmonary effort is normal.     Breath sounds: Normal breath sounds.  Musculoskeletal:     Right shoulder: Normal.     Left shoulder: Tenderness and crepitus present. No swelling, deformity, effusion, laceration or bony tenderness. Normal range of motion. Normal strength. Normal pulse.     Cervical  back: Normal.     Right knee: Normal.     Left knee: Swelling and crepitus present. No effusion or erythema. Normal range of motion. No tenderness. Normal alignment, normal meniscus and normal patellar mobility.     Instability Tests: Anterior drawer test negative. Posterior drawer test negative.     Right lower leg: Normal. No edema.     Left lower leg: Normal. No edema.     Comments: Left anterior shoulder pain  Neurological:     Mental Status: He is alert and oriented to person, place, and time.     No results found for any  visits on 01/10/23.    Assessment & Plan:    Problem List Items Addressed This Visit       Cardiovascular and Mediastinum   Primary hypertension - Primary    BP not at goal due to med and diet non compliance. BP Readings from Last 3 Encounters:  01/10/23 (!) 140/100  01/07/22 (!) 160/100    Advised about possible adverse effects of uncontrolled BP, including its correlation with ED; need for DASH diet and minimize ETOH consumption. Resume losartan F/up in 17month      Relevant Medications   losartan (COZAAR) 25 MG tablet     Musculoskeletal and Integument   Arthritis of knee, left    Chronic, associated with joint instability Advised to use knee brace Resume mobic and muscle relaxant F/up with ortho      Relevant Medications   cyclobenzaprine (FLEXERIL) 5 MG tablet   meloxicam (MOBIC) 7.5 MG tablet   Other Relevant Orders   Ambulatory referral to Orthopedics   Primary osteoarthritis of left shoulder    Chronic No change Resume mobic and muscle relaxant F/up with ortho      Relevant Medications   cyclobenzaprine (FLEXERIL) 5 MG tablet   meloxicam (MOBIC) 7.5 MG tablet   Other Relevant Orders   CBC   Comprehensive metabolic panel   Ambulatory referral to Orthopedics     Other   Cervical stenosis of spinal canal    Chronic No change Resume mobic and muscle relaxant F/up with ortho      Relevant Medications   cyclobenzaprine (FLEXERIL) 5 MG tablet   meloxicam (MOBIC) 7.5 MG tablet   Other Relevant Orders   Ambulatory referral to Orthopedics   Erectile dysfunction    He has AM erection and normal libido, but unable to sustain and erection. No illicit drug use, no tobacco use ETOH use: 4beers daily with unisom sleep aid (3tabs) Uncontrolled BP.  Repeat prolactin, testosterone, TSH, CBC, A1c, CMP, PSA. Advised to quit ETOH use or minimize to 1drink daily Advised to avoid use of sleep aid with ETOH. Send cialis if normal labs      Relevant Orders    CBC   Comprehensive metabolic panel   TSH   Testosterone,Free and Total   Hemoglobin A1c   Prolactin   Other Visit Diagnoses     Encounter for lipid screening for cardiovascular disease       Relevant Orders   Lipid panel   Nocturia       Relevant Orders   PSA      Return in about 4 weeks (around 02/07/2023) for HTN.     Wilfred Lacy, NP

## 2023-01-10 NOTE — Assessment & Plan Note (Signed)
He has AM erection and normal libido, but unable to sustain and erection. No illicit drug use, no tobacco use ETOH use: 4beers daily with unisom sleep aid (3tabs) Uncontrolled BP.  Repeat prolactin, testosterone, TSH, CBC, A1c, CMP, PSA. Advised to quit ETOH use or minimize to 1drink daily Advised to avoid use of sleep aid with ETOH. Send cialis if normal labs

## 2023-01-10 NOTE — Patient Instructions (Addendum)
Resume losartan, meloxicam and flexeril. Minimize alcohol consumption to drink daily Do not use more than recommended dose of sleep aid and do not combine with ETOH consumption/ Maintain low salt diet. Go to [facility] for lab draw. You need to be fasting for 6-8hrs prior to blood draw. Ok to drink water and take BP medication  Alcohol Use Disorder Alcohol use disorder is a condition in which drinking disrupts daily life. People with this condition drink too much alcohol and cannot control their drinking. Alcohol use disorder can cause serious problems with physical health. It can affect the brain, heart, and other internal organs. This disorder can raise the risk for certain cancers and cause problems with mental health, such as depression or anxiety. What are the causes? This condition is caused by drinking too much alcohol over time. Some people with this condition drink to cope with or escape from negative life events. Others drink to relieve symptoms of physical pain or symptoms of mental illness. What increases the risk? You are more likely to develop this condition if: You have a family history of alcohol use disorder. Your culture encourages drinking to the point of becoming drunk (intoxication). You had a mood or conduct disorder in childhood. You have been abused. You are an adolescent and you: Have poor performance in school. Have poor supervision or guidance. Act on impulse and like taking risks. What are the signs or symptoms? Symptoms of this condition include: Drinking more than you want to. Trying several times without success to drink less. Spending a lot of time thinking about alcohol, getting alcohol, drinking alcohol, or recovering from drinking alcohol. Continuing to drink even when it is causing serious problems in your daily life. Drinking when it is dangerous to drink, such as before driving a car. Needing more and more alcohol to get the same  effect you want (building up tolerance). Having symptoms of withdrawal when you stop drinking. Withdrawal symptoms may include: Trouble sleeping, leading to tiredness (fatigue). Mood swings of depression and anxiety. Physical symptoms, such as a fast heart rate, rapid breathing, high blood pressure (hypertension), fever, cold sweats, or nausea. Seizures. Severe confusion. Feeling or seeing things that are not there (hallucinations). Shaking movements that you cannot control (tremors). How is this diagnosed? This condition is diagnosed with an assessment. Your health care provider may start by asking three or four questions about your drinking, or they may give you a simple test to take. This helps to get clear information from you. You may also have a physical exam or lab tests. You may be referred to a substance abuse counselor. How is this treated? With education, some people with alcohol use disorder are able to reduce their drinking. Many with this disorder cannot change their drinking behavior on their own and need help with treatment from substance use specialists. Treatments may include: Detoxification. Detoxification involves quitting drinking with supervision and direction of health care providers. Your health care provider may prescribe medicines within the first week to help lessen withdrawal symptoms. Alcohol withdrawal can be dangerous and life-threatening. Detoxification may be provided in a home, community, or primary care setting, or in a hospital or substance use treatment facility. Counseling. This may involve motivational interviewing (MI), family therapy, or cognitive behavioral therapy (CBT). A counselor can address the things you can do to change your drinking behavior and how to maintain the changes. Talk therapy aims to: Identify your positive motivations to change. Identify and avoid the things that  trigger your drinking. Help you learn how to plan your behavior  change. Develop support systems that can help you sustain the change. Medicines. Medicines can help treat this disorder by: Decreasing cravings. Decreasing the positive feeling you have when you drink. Causing an uncomfortable physical reaction when you drink (aversion therapy). Support groups such as Alcoholics Anonymous (AA). These groups are led by people who have quit drinking. The groups provide emotional support, advice, and guidance. Some people with this condition benefit from a combination of treatments provided by specialized substance use treatment centers. Follow these instructions at home:  Medicines Take over-the-counter and prescription medicines only as told by your health care provider. Ask before starting any new medicines, herbs, or supplements. General instructions Ask friends and family members to support your choice to stay sober. Avoid places where alcohol is served. Create a plan to deal with tempting situations. Attend support groups regularly. Practice hobbies or activities you enjoy. Do not drink and drive. How is this prevented? Do not drink alcohol if your health care provider tells you not to drink. If you drink alcohol: Limit how much you have to: 0-1 drink a day for women who are not pregnant. 0-2 drinks a day for men. Know how much alcohol is in your drink. In the [place] one drink equals one 12 oz bottle of beer (355 mL), one 5 oz glass of wine (148 mL), or one 1 oz glass of hard liquor (44 mL). If you have a mental health condition, seek treatment. Develop a healthy lifestyle through: Meditation or deep breathing. Exercise. Spending time in nature. Listening to music. Talking with a trusted friend or family member. If you are a teen: Do not drink alcohol. Avoid gatherings where you might be tempted to drink alcohol. Do not be afraid to say no if someone offers you alcohol. Speak up about why you do not want to drink. Set a positive example for  others around you by not drinking. Build relationships with friends who do not drink. Where to find more information Substance Abuse and Mental Health Services Administration: SamedayNews.com.cy Alcoholics Anonymous: ShedSizes.ch Contact a health care provider if: You cannot take your medicines as told. Your symptoms get worse or you experience symptoms of withdrawal when you stop drinking. You start drinking again (relapse) and your symptoms get worse. Get help right away if: You have thoughts about hurting yourself or others. Get help right away if you feel like you may hurt yourself or others, or have thoughts about taking your own life. Go to your nearest emergency room or: Call 911. Call the [facility] at [phone] or 988. This is open 24 hours a day. Text the Crisis Text Line at 573-357-8981. Summary Alcohol use disorder is a condition in which drinking disrupts daily life. People with this condition drink too much alcohol and cannot control their drinking. Treatment may include detoxification, counseling, medicines, and support groups. Ask friends and family members to support you. Avoid situations where alcohol is served. Get help right away if you have thoughts about hurting yourself or others. This information is not intended to replace advice given to you by your health care provider. Make sure you discuss any questions you have with your health care provider. Document Revised: 12/15/2021 Document Reviewed: 12/15/2021 Elsevier Patient Education  [place].

## 2023-01-12 ENCOUNTER — Other Ambulatory Visit (INDEPENDENT_AMBULATORY_CARE_PROVIDER_SITE_OTHER): Payer: Federal, State, Local not specified - PPO

## 2023-01-12 DIAGNOSIS — N528 Other male erectile dysfunction: Secondary | ICD-10-CM

## 2023-01-12 DIAGNOSIS — E781 Pure hyperglyceridemia: Secondary | ICD-10-CM

## 2023-01-12 DIAGNOSIS — R351 Nocturia: Secondary | ICD-10-CM

## 2023-01-12 DIAGNOSIS — E1169 Type 2 diabetes mellitus with other specified complication: Secondary | ICD-10-CM

## 2023-01-12 DIAGNOSIS — M19012 Primary osteoarthritis, left shoulder: Secondary | ICD-10-CM

## 2023-01-12 DIAGNOSIS — R7989 Other specified abnormal findings of blood chemistry: Secondary | ICD-10-CM

## 2023-01-12 LAB — COMPREHENSIVE METABOLIC PANEL
ALT: 26 U/L (ref 0–53)
AST: 21 U/L (ref 0–37)
Albumin: 4.1 g/dL (ref 3.5–5.2)
Alkaline Phosphatase: 66 U/L (ref 39–117)
BUN: 15 mg/dL (ref 6–23)
CO2: 30 mEq/L (ref 19–32)
Calcium: 9.2 mg/dL (ref 8.4–10.5)
Chloride: 102 mEq/L (ref 96–112)
Creatinine, Ser: 0.97 mg/dL (ref 0.40–1.50)
GFR: 86.3 mL/min (ref >60.00)
Glucose, Bld: 123 mg/dL — ABNORMAL HIGH (ref 70–99)
Potassium: 4.8 mEq/L (ref 3.5–5.1)
Sodium: 137 mEq/L (ref 135–145)
Total Bilirubin: 0.3 mg/dL (ref 0.2–1.2)
Total Protein: 6.9 g/dL (ref 6.0–8.3)

## 2023-01-12 LAB — LIPID PANEL
Cholesterol: 210 mg/dL — ABNORMAL HIGH (ref 0–200)
HDL: 33.8 mg/dL — ABNORMAL LOW (ref >39.00)
NonHDL: 176.38
Total CHOL/HDL Ratio: 6
Triglycerides: 377 mg/dL — ABNORMAL HIGH (ref 0.0–149.0)
VLDL: 75.4 mg/dL — ABNORMAL HIGH (ref 0.0–40.0)

## 2023-01-12 LAB — CBC
HCT: 43 % (ref 39.0–52.0)
Hemoglobin: 14.3 g/dL (ref 13.0–17.0)
MCHC: 33.2 g/dL (ref 30.0–36.0)
MCV: 83.6 fl (ref 78.0–100.0)
Platelets: 224 10*3/uL (ref 150.0–400.0)
RBC: 5.14 Mil/uL (ref 4.22–5.81)
RDW: 13.5 % (ref 11.5–15.5)
WBC: 6.9 10*3/uL (ref 4.0–10.5)

## 2023-01-12 LAB — TSH: TSH: 0.06 u[IU]/mL — ABNORMAL LOW (ref 0.35–5.50)

## 2023-01-12 LAB — PSA: PSA: 0.35 ng/mL (ref 0.10–4.00)

## 2023-01-12 LAB — LDL CHOLESTEROL, DIRECT: Direct LDL: 118 mg/dL

## 2023-01-12 LAB — HEMOGLOBIN A1C: Hgb A1c MFr Bld: 6.6 % — ABNORMAL HIGH (ref 4.6–6.5)

## 2023-01-12 MED ORDER — FENOFIBRATE 145 MG PO TABS: 145.0000 mg | ORAL_TABLET | Freq: Every day | ORAL | 1 refills | Status: DC

## 2023-01-12 NOTE — Progress Notes (Signed)
Normal PSA, cbc, renal and liver function hgbA1c at 6.6%: type 2 diabetes. Need to maintain low carb/low sugar diet. Stop ETOH consumption. Very low TSH: possible hyperthyroidism. Need to repeat labs in 2weeks Abnormal lipid panel: very elevated triglyceride which puts you at risk for fatty liver and pancreatitis. Need to stop ETOH consumption. Maintain low carb/low fat diet. Entered referral to nutritionist. Start daily exercise. Start fenofibrate prescription Schedule lab appt.

## 2023-01-12 NOTE — Addendum Note (Signed)
Addended by: Leana Gamer on: 01/12/2023 03:29 PM   Modules accepted: Orders

## 2023-01-13 LAB — PROLACTIN: Prolactin: 6 ng/mL (ref 2.0–18.0)

## 2023-01-14 LAB — SPECIMEN STATUS REPORT

## 2023-01-14 LAB — TESTOSTERONE,FREE AND TOTAL
Testosterone, Free: 12.1 pg/mL (ref 7.2–24.0)
Testosterone: 755 ng/dL (ref 264–916)

## 2023-01-24 ENCOUNTER — Other Ambulatory Visit: Payer: Federal, State, Local not specified - PPO

## 2023-01-24 DIAGNOSIS — R7989 Other specified abnormal findings of blood chemistry: Secondary | ICD-10-CM | POA: Diagnosis not present

## 2023-01-25 ENCOUNTER — Ambulatory Visit: Payer: Federal, State, Local not specified - PPO | Admitting: Orthopaedic Surgery

## 2023-01-25 DIAGNOSIS — M4802 Spinal stenosis, cervical region: Secondary | ICD-10-CM | POA: Diagnosis not present

## 2023-01-25 DIAGNOSIS — M1712 Unilateral primary osteoarthritis, left knee: Secondary | ICD-10-CM

## 2023-01-25 LAB — THYROID PANEL WITH TSH
Free Thyroxine Index: 1.6 (ref 1.4–3.8)
T3 Uptake: 37 % — ABNORMAL HIGH (ref 22–35)
T4, Total: 4.3 ug/dL — ABNORMAL LOW (ref 4.9–10.5)
TSH: 3.08 mIU/L (ref 0.40–4.50)

## 2023-01-25 MED ORDER — METHYLPREDNISOLONE ACETATE 40 MG/ML IJ SUSP
40.0000 mg | INTRAMUSCULAR | Status: AC | PRN
Start: 2023-01-25 — End: 2023-01-25
  Administered 2023-01-25: 40 mg via INTRA_ARTICULAR

## 2023-01-25 MED ORDER — BUPIVACAINE HCL 0.5 % IJ SOLN
2.0000 mL | INTRAMUSCULAR | Status: AC | PRN
  Administered 2023-01-25: 2 mL via INTRA_ARTICULAR

## 2023-01-25 MED ORDER — LIDOCAINE HCL 1 % IJ SOLN
2.0000 mL | INTRAMUSCULAR | Status: AC | PRN
  Administered 2023-01-25: 2 mL

## 2023-01-25 NOTE — Progress Notes (Signed)
Office Visit Note   Patient: Curtis Bowman           Date of Birth: 1965/12/10           MRN: [id number] Visit Date: 01/25/2023              Requested by: Flossie Buffy, NP [place],  [place] [id number] PCP: Flossie Buffy, NP   Assessment & Plan: Visit Diagnoses:  1. Cervical stenosis of spinal canal   2. Primary osteoarthritis of left knee     Plan: Impression is 57 year old gentleman with symptomatic left knee osteoarthritis and cervical radiculopathy.  His medial compartment is fairly degenerative.  He had a really good response to the last injection so we will repeat this today.  In regards to the radiculopathy we will make a referral back to Dr. Ernestina Patches for [facility].  Follow-Up Instructions: No follow-ups on file.   Orders:  Orders Placed This Encounter  Procedures   Large Joint Inj   No orders of the defined types were placed in this encounter.     Procedures: Large Joint Inj: L knee on 01/25/2023 9:21 AM Details: 22 G needle Medications: 2 mL bupivacaine 0.5 %; 2 mL lidocaine 1 %; 40 mg methylPREDNISolone acetate 40 MG/ML Outcome: tolerated well, no immediate complications Patient was prepped and draped in the usual sterile fashion.       Clinical Data: No additional findings.   Subjective: Chief Complaint  Patient presents with   Left Shoulder - Pain   Left Knee - Pain    HPI  Patient returns today for recurrent left knee pain and left shoulder pain and radiculopathy.  We saw him about a year ago for the exact same issues.  He responded really well to a steroid injection and would like this repeated today.  He denies any recent injuries.  He reports medial sided knee pain without any mechanical symptoms.  In regards to the shoulder and the radicular pain he never follow through with an ESI injection which we originally recommended but he is interested in doing this now.  Review of Systems  Constitutional: Negative.   HENT:  Negative.    Eyes: Negative.   Respiratory: Negative.    Cardiovascular: Negative.   Gastrointestinal: Negative.   Endocrine: Negative.   Genitourinary: Negative.   Skin: Negative.   Allergic/Immunologic: Negative.   Neurological: Negative.   Hematological: Negative.   Psychiatric/Behavioral: Negative.    All other systems reviewed and are negative.    Objective: Vital Signs: There were no vitals taken for this visit.  Physical Exam Vitals and nursing note reviewed.  Constitutional:      Appearance: He is well-developed.  HENT:     Head: Normocephalic and atraumatic.  Eyes:     Pupils: Pupils are equal, round, and reactive to light.  Pulmonary:     Effort: Pulmonary effort is normal.  Abdominal:     Palpations: Abdomen is soft.  Musculoskeletal:        General: Normal range of motion.     Cervical back: Neck supple.  Skin:    General: Skin is warm.  Neurological:     Mental Status: He is alert and oriented to person, place, and time.  Psychiatric:        Behavior: Behavior normal.        Thought Content: Thought content normal.        Judgment: Judgment normal.     Ortho Exam  Examination left knee shows medial joint line tenderness.  Trace effusion.  Causing cruciates are stable. Examination of cervical spine is unchanged.  Specialty Comments:  MRI CERVICAL SPINE WITHOUT CONTRAST   TECHNIQUE: Multiplanar, multisequence MR imaging of the cervical spine was performed. No intravenous contrast was administered.   COMPARISON:  None.   FINDINGS: Alignment: Straightening of the cervical lordosis. No static listhesis.   Vertebrae: No fracture, evidence of discitis, or bone lesion. Multilevel discogenic endplate marrow changes and anterior endplate spurring.   Cord: Normal signal and morphology.   Posterior Fossa, vertebral arteries, paraspinal tissues: Negative.   Disc levels:   C2-C3: Small posterior disc osteophyte complex and mild facet arthrosis.  There is mild impress upon the ventral thecal sac without canal stenosis. Bilateral foramina are patent.   C3-C4: No significant disc protrusion, foraminal stenosis, or canal stenosis.   C4-C5: Mild posterior disc osteophyte complex with mild bilateral facet arthrosis. There is mild canal stenosis without significant foraminal stenosis.   C5-C6: Posterior disc osteophyte complex and mild bilateral facet and uncovertebral arthropathy resulting in mild canal stenosis and mild left foraminal stenosis.   C6-C7: Posterior disc osteophyte complex and mild bilateral facet arthrosis resulting in mild canal stenosis and mild left foraminal stenosis.   C7-T1: No significant disc protrusion, foraminal stenosis, or canal stenosis.   IMPRESSION: 1. Mild cervical spondylosis from C2 through C7 with mild canal stenosis at C4-5 through C6-C7. 2. Mild left foraminal stenosis at C5-6 and C6-7.     Electronically Signed   By: Davina Poke M.D.   On: 08/19/2019 09:15  Imaging: No results found.   PMFS History: Patient Active Problem List   Diagnosis Date Noted   Arthritis of knee, left 01/10/2023   Erectile dysfunction 01/07/2022   Cervical stenosis of spinal canal 01/07/2022   Primary hypertension 01/07/2022   Knee instability, left 01/07/2022   Primary osteoarthritis of left shoulder 01/07/2022   No past medical history on file.  Family History  Problem Relation Age of Onset   Diabetes Mother    Hypertension Mother    Congestive Heart Failure Mother    Hypertension Father    Hypertension Sister    Diabetes Sister    Hypertension Brother    Diabetes Brother    Stroke Paternal Uncle    Hypertension Paternal Uncle    Heart disease Paternal Grandfather     No past surgical history on file. Social History   Occupational History   Not on file  Tobacco Use   Smoking status: Never   Smokeless tobacco: Never  Vaping Use   Vaping Use: Never used  Substance and Sexual  Activity   Alcohol use: Yes    Comment: moderate   Drug use: Yes    Frequency: 7.0 times per week    Types: Marijuana   Sexual activity: Yes

## 2023-01-30 ENCOUNTER — Ambulatory Visit: Payer: Federal, State, Local not specified - PPO | Admitting: Physical Medicine and Rehabilitation

## 2023-02-07 ENCOUNTER — Ambulatory Visit: Payer: Federal, State, Local not specified - PPO | Admitting: Nurse Practitioner

## 2023-02-07 ENCOUNTER — Encounter: Payer: Self-pay | Admitting: Nurse Practitioner

## 2023-02-07 VITALS — BP 120/100 | HR 94 | Temp 98.1°F | Resp 16 | Ht 69.5 in | Wt 192.0 lb

## 2023-02-07 DIAGNOSIS — M4802 Spinal stenosis, cervical region: Secondary | ICD-10-CM

## 2023-02-07 DIAGNOSIS — N521 Erectile dysfunction due to diseases classified elsewhere: Secondary | ICD-10-CM

## 2023-02-07 DIAGNOSIS — I10 Essential (primary) hypertension: Secondary | ICD-10-CM | POA: Diagnosis not present

## 2023-02-07 DIAGNOSIS — E781 Pure hyperglyceridemia: Secondary | ICD-10-CM | POA: Diagnosis not present

## 2023-02-07 DIAGNOSIS — E1165 Type 2 diabetes mellitus with hyperglycemia: Secondary | ICD-10-CM | POA: Diagnosis not present

## 2023-02-07 DIAGNOSIS — F1010 Alcohol abuse, uncomplicated: Secondary | ICD-10-CM | POA: Insufficient documentation

## 2023-02-07 DIAGNOSIS — F19982 Other psychoactive substance use, unspecified with psychoactive substance-induced sleep disorder: Secondary | ICD-10-CM

## 2023-02-07 DIAGNOSIS — F1011 Alcohol abuse, in remission: Secondary | ICD-10-CM | POA: Insufficient documentation

## 2023-02-07 LAB — MICROALBUMIN / CREATININE URINE RATIO
Creatinine,U: 206.8 mg/dL
Microalb Creat Ratio: 5.8 mg/g (ref 0.0–30.0)
Microalb, Ur: 11.9 mg/dL — ABNORMAL HIGH (ref 0.0–1.9)

## 2023-02-07 MED ORDER — LOSARTAN POTASSIUM 50 MG PO TABS
50.0000 mg | ORAL_TABLET | Freq: Every day | ORAL | 1 refills | Status: DC
Start: 2023-02-07 — End: 2023-08-10

## 2023-02-07 MED ORDER — CYCLOBENZAPRINE HCL 5 MG PO TABS
5.0000 mg | ORAL_TABLET | Freq: Every evening | ORAL | 2 refills | Status: DC | PRN
Start: 2023-02-07 — End: 2023-08-10

## 2023-02-07 MED ORDER — TRAZODONE HCL 50 MG PO TABS
25.0000 mg | ORAL_TABLET | Freq: Every evening | ORAL | 2 refills | Status: DC | PRN
Start: 2023-02-07 — End: 2023-08-10

## 2023-02-07 NOTE — Progress Notes (Signed)
Established Patient Visit  Patient: Curtis Bowman   DOB: 1966-02-22   57 y.o. Male  MRN: [id number] Visit Date: 02/07/2023  Subjective:    Chief Complaint  Patient presents with   Medical Management of Chronic Issues    HTN   HPI Primary hypertension Improved but not at goal. Reports he is compliant with med dose. Denies any adverse effects BP Readings from Last 3 Encounters:  02/07/23 (!) 120/100  01/10/23 (!) 140/100  01/07/22 (!) 160/100    Increase losartan does to  daily Advised to maintain low salt diet F/up in 66month  ETOH abuse Admits he uses ETOH to manage stressors at home and insomnia. Also combines med with unisom-3tabs He has cut amount to 3beer daily in last 4weeks from 6-12beers daily Associated with hypertriglyceridemia , HTN and DM-2  Advised about possible complications of ETOH abuse. Encourage to minimize consumption to 1beer daily. He agreed to trazodone rx at hs. He declined need for psychology referral at this time. Advised to not to combine trazodone with unisom or alcohol or flexeril. He verbalized understanding F/up in 66month  Erectile dysfunction Normal TSH, PSA, testosterone and prolactin This is possibly secondary to ETOH abuse, HTN and DM-2. Advised about importance of lifestyle modification and medication compliance. He verbalized understanding. Will send viagra rx once BP is at goal  Type 2 diabetes mellitus with hyperglycemia, without long-term current use of insulin New diagnosis. Denies any neuropathy. Advised about possible complications including ED, need for lifestyle modifications and appt with nutritionist, need for annual eye exam, dental cleaning every 6months, and skin care. He verbalized understanding. He stated he is familiar with DM disease process due to Fhx (father and brother) He agreed to ophthalmology and nutritionist referral. Collect UACr today F/up in 3months  Wt Readings from Last 3  Encounters:  02/07/23 192 lb (87.1 kg)  01/10/23 197 lb (89.4 kg)  01/07/22 172 lb 6.4 oz (78.2 kg)    Reviewed medical, surgical, and social history today  Medications: Outpatient Medications Prior to Visit  Medication Sig   fenofibrate (TRICOR) 145 MG tablet Take 1 tablet (145 mg total) by mouth daily.   meloxicam (MOBIC) 7.5 MG tablet Take 1 tablet (7.5 mg total) by mouth daily. With food   [DISCONTINUED] cyclobenzaprine (FLEXERIL) 5 MG tablet Take 1-2 tablets (5-10 mg total) by mouth at bedtime as needed for muscle spasms.   [DISCONTINUED] losartan (COZAAR) 25 MG tablet Take 1 tablet (25 mg total) by mouth daily.   No facility-administered medications prior to visit.   Reviewed past medical and social history.   ROS per HPI above  Last CBC Lab Results  Component Value Date   WBC 6.9 01/12/2023   HGB 14.3 01/12/2023   HCT 43.0 01/12/2023   MCV 83.6 01/12/2023   RDW 13.5 01/12/2023   PLT 224.0 01/12/2023   Last metabolic panel Lab Results  Component Value Date   GLUCOSE 123 (H) 01/12/2023   NA 137 01/12/2023   K 4.8 01/12/2023   CL 102 01/12/2023   CO2 30 01/12/2023   BUN 15 01/12/2023   CREATININE 0.97 01/12/2023   CALCIUM 9.2 01/12/2023   PROT 6.9 01/12/2023   ALBUMIN 4.1 01/12/2023   BILITOT 0.3 01/12/2023   ALKPHOS 66 01/12/2023   AST 21 01/12/2023   ALT 26 01/12/2023   Last lipids Lab Results  Component Value Date   CHOL 210 (H) 01/12/2023  HDL 33.80 (L) 01/12/2023   LDLDIRECT 118.0 01/12/2023   TRIG 377.0 (H) 01/12/2023   CHOLHDL 6 01/12/2023   Last hemoglobin A1c Lab Results  Component Value Date   HGBA1C 6.6 (H) 01/12/2023        Objective:  BP (!) 120/100   Pulse 94   Temp 98.1 F (36.7 C) (Temporal)   Resp 16   Ht 5' 9.5" (1.765 m)   Wt 192 lb (87.1 kg)   SpO2 96%   BMI 27.95 kg/m      Physical Exam Cardiovascular:     Rate and Rhythm: Normal rate and regular rhythm.     Pulses: Normal pulses.     Heart sounds: Normal  heart sounds.  Pulmonary:     Effort: Pulmonary effort is normal.     Breath sounds: Normal breath sounds.  Neurological:     Mental Status: He is alert and oriented to person, place, and time.     No results found for any visits on 02/07/23.    Assessment & Plan:    Problem List Items Addressed This Visit       Cardiovascular and Mediastinum   Primary hypertension - Primary    Improved but not at goal. Reports he is compliant with med dose. Denies any adverse effects BP Readings from Last 3 Encounters:  02/07/23 (!) 120/100  01/10/23 (!) 140/100  01/07/22 (!) 160/100    Increase losartan does to 50mg  daily Advised to maintain low salt diet F/up in 52month      Relevant Medications   losartan (COZAAR) 50 MG tablet     Endocrine   Type 2 diabetes mellitus with hyperglycemia, without long-term current use of insulin    New diagnosis. Denies any neuropathy. Advised about possible complications including ED, need for lifestyle modifications and appt with nutritionist, need for annual eye exam, dental cleaning every 6months, and skin care. He verbalized understanding. He stated he is familiar with DM disease process due to Fhx (father and brother) He agreed to ophthalmology and nutritionist referral. Collect UACr today F/up in 3months       Relevant Medications   losartan (COZAAR) 50 MG tablet   Other Relevant Orders   Ambulatory referral to Ophthalmology   Microalbumin / creatinine urine ratio     Other   Cervical stenosis of spinal canal   Relevant Medications   cyclobenzaprine (FLEXERIL) 5 MG tablet   Erectile dysfunction    Normal TSH, PSA, testosterone and prolactin This is possibly secondary to ETOH abuse, HTN and DM-2. Advised about importance of lifestyle modification and medication compliance. He verbalized understanding. Will send viagra rx once BP is at goal      ETOH abuse    Admits he uses ETOH to manage stressors at home and insomnia. Also combines  med with unisom-3tabs He has cut amount to 3beer daily in last 4weeks from 6-12beers daily Associated with hypertriglyceridemia , HTN and DM-2  Advised about possible complications of ETOH abuse. Encourage to minimize consumption to 1beer daily. He agreed to trazodone rx at hs. He declined need for psychology referral at this time. Advised to not to combine trazodone with unisom or alcohol or flexeril. He verbalized understanding F/up in 52month      Other Visit Diagnoses     Hypertriglyceridemia       Relevant Medications   losartan (COZAAR) 50 MG tablet   Drug-induced insomnia       Relevant Medications   traZODone (DESYREL) 50 MG  tablet      Return in about 4 weeks (around 03/07/2023) for depression and anxiety, HTN, DM.     Alysia Penna, NP

## 2023-02-07 NOTE — Assessment & Plan Note (Signed)
Improved but not at goal. Reports he is compliant with med dose. Denies any adverse effects BP Readings from Last 3 Encounters:  02/07/23 (!) 120/100  01/10/23 (!) 140/100  01/07/22 (!) 160/100    Increase losartan does to  daily Advised to maintain low salt diet F/up in 68month

## 2023-02-07 NOTE — Assessment & Plan Note (Addendum)
New diagnosis. Denies any neuropathy. Advised about possible complications including ED, need for lifestyle modifications and appt with nutritionist, need for annual eye exam, dental cleaning every 6months, and skin care. He verbalized understanding. He stated he is familiar with DM disease process due to Fhx (father and brother) He agreed to ophthalmology and nutritionist referral. Collect UACr today F/up in 3months

## 2023-02-07 NOTE — Assessment & Plan Note (Signed)
Admits he uses ETOH to manage stressors at home and insomnia. Also combines med with unisom-3tabs He has cut amount to 3beer daily in last 4weeks from 6-12beers daily Associated with hypertriglyceridemia , HTN and DM-2  Advised about possible complications of ETOH abuse. Encourage to minimize consumption to 1beer daily. He agreed to trazodone rx at hs. He declined need for psychology referral at this time. Advised to not to combine trazodone with unisom or alcohol or flexeril. He verbalized understanding F/up in 66month

## 2023-02-07 NOTE — Patient Instructions (Addendum)
Increase losartan to  Do not combine trazodone with unisom or alcohol or flexeril.  Diabetes Mellitus and Nutrition, Adult When you have diabetes, or diabetes mellitus, it is very important to have healthy eating habits because your blood sugar (glucose) levels are greatly affected by what you eat and drink. Eating healthy foods in the right amounts, at about the same times every day, can help you: Manage your blood glucose. Lower your risk of heart disease. Improve your blood pressure. Reach or maintain a healthy weight. What can affect my meal plan? Every person with diabetes is different, and each person has different needs for a meal plan. Your health care provider may recommend that you work with a dietitian to make a meal plan that is best for you. Your meal plan may vary depending on factors such as: The calories you need. The medicines you take. Your weight. Your blood glucose, blood pressure, and cholesterol levels. Your activity level. Other health conditions you have, such as heart or kidney disease. How do carbohydrates affect me? Carbohydrates, also called carbs, affect your blood glucose level more than any other type of food. Eating carbs raises the amount of glucose in your blood. It is important to know how many carbs you can safely have in each meal. This is different for every person. Your dietitian can help you calculate how many carbs you should have at each meal and for each snack. How does alcohol affect me? Alcohol can cause a decrease in blood glucose (hypoglycemia), especially if you use insulin or take certain diabetes medicines by mouth. Hypoglycemia can be a life-threatening condition. Symptoms of hypoglycemia, such as sleepiness, dizziness, and confusion, are similar to symptoms of having too much alcohol. Do not drink alcohol if: Your health care provider tells you not to drink. You are pregnant, may be pregnant, or are planning to become pregnant. If you  drink alcohol: Limit how much you have to: 0-1 drink a day for women. 0-2 drinks a day for men. Know how much alcohol is in your drink. In the [place] one drink equals one 12 oz bottle of beer (355 mL), one 5 oz glass of wine (148 mL), or one 1 oz glass of hard liquor (44 mL). Keep yourself hydrated with water, diet soda, or unsweetened iced tea. Keep in mind that regular soda, juice, and other mixers may contain a lot of sugar and must be counted as carbs. What are tips for following this plan?  Reading food labels Start by checking the serving size on the Nutrition Facts label of packaged foods and drinks. The number of calories and the amount of carbs, fats, and other nutrients listed on the label are based on one serving of the item. Many items contain more than one serving per package. Check the total grams (g) of carbs in one serving. Check the number of grams of saturated fats and trans fats in one serving. Choose foods that have a low amount or none of these fats. Check the number of milligrams (mg) of salt (sodium) in one serving. Most people should limit total sodium intake to less than 2,300 mg per day. Always check the nutrition information of foods labeled as "low-fat" or "nonfat." These foods may be higher in added sugar or refined carbs and should be avoided. Talk to your dietitian to identify your daily goals for nutrients listed on the label. Shopping Avoid buying canned, pre-made, or processed foods. These foods tend to be high in fat, sodium, and  added sugar. Shop around the outside edge of the grocery store. This is where you will most often find fresh fruits and vegetables, bulk grains, fresh meats, and fresh dairy products. Cooking Use low-heat cooking methods, such as baking, instead of high-heat cooking methods, such as deep frying. Cook using healthy oils, such as olive, canola, or sunflower oil. Avoid cooking with butter, cream, or high-fat meats. Meal planning Eat  meals and snacks regularly, preferably at the same times every day. Avoid going long periods of time without eating. Eat foods that are high in fiber, such as fresh fruits, vegetables, beans, and whole grains. Eat 4-6 oz (112-168 g) of lean protein each day, such as lean meat, chicken, fish, eggs, or tofu. One ounce (oz) (28 g) of lean protein is equal to: 1 oz (28 g) of meat, chicken, or fish. 1 egg.  cup (62 g) of tofu. Eat some foods each day that contain healthy fats, such as avocado, nuts, seeds, and fish. What foods should I eat? Fruits Berries. Apples. Oranges. Peaches. Apricots. Plums. Grapes. Mangoes. Papayas. Pomegranates. Kiwi. Cherries. Vegetables Leafy greens, including lettuce, spinach, kale, chard, collard greens, mustard greens, and cabbage. Beets. Cauliflower. Broccoli. Carrots. Green beans. Tomatoes. Peppers. Onions. Cucumbers. Brussels sprouts. Grains Whole grains, such as whole-wheat or whole-grain bread, crackers, tortillas, cereal, and pasta. Unsweetened oatmeal. Quinoa. Brown or wild rice. Meats and other proteins Seafood. Poultry without skin. Lean cuts of poultry and beef. Tofu. Nuts. Seeds. Dairy Low-fat or fat-free dairy products such as milk, yogurt, and cheese. The items listed above may not be a complete list of foods and beverages you can eat and drink. Contact a dietitian for more information. What foods should I avoid? Fruits Fruits canned with syrup. Vegetables Canned vegetables. Frozen vegetables with butter or cream sauce. Grains Refined white flour and flour products such as bread, pasta, snack foods, and cereals. Avoid all processed foods. Meats and other proteins Fatty cuts of meat. Poultry with skin. Breaded or fried meats. Processed meat. Avoid saturated fats. Dairy Full-fat yogurt, cheese, or milk. Beverages Sweetened drinks, such as soda or iced tea. The items listed above may not be a complete list of foods and beverages you should avoid.  Contact a dietitian for more information. Questions to ask a health care provider Do I need to meet with a certified diabetes care and education specialist? Do I need to meet with a dietitian? What number can I call if I have questions? When are the best times to check my blood glucose? Where to find more information: American Diabetes Association: diabetes.org Academy of Nutrition and Dietetics: eatright.[employer] of Diabetes and Digestive and Kidney Diseases: [employer]is Association of Diabetes Care & Education Specialists: diabeteseducator.org Summary It is important to have healthy eating habits because your blood sugar (glucose) levels are greatly affected by what you eat and drink. It is important to use alcohol carefully. A healthy meal plan will help you manage your blood glucose and lower your risk of heart disease. Your health care provider may recommend that you work with a dietitian to make a meal plan that is best for you. This information is not intended to replace advice given to you by your health care provider. Make sure you discuss any questions you have with your health care provider. Document Revised: 05/13/2020 Document Reviewed: 05/13/2020 Elsevier Patient Education  [employer].

## 2023-02-07 NOTE — Assessment & Plan Note (Addendum)
Normal TSH, PSA, testosterone and prolactin This is possibly secondary to ETOH abuse, HTN and DM-2. Advised about importance of lifestyle modification and medication compliance. He verbalized understanding. Will send viagra rx once BP is at goal

## 2023-02-07 NOTE — Progress Notes (Signed)
Stable Follow instructions as discussed during office visit.

## 2023-03-02 ENCOUNTER — Ambulatory Visit: Payer: Federal, State, Local not specified - PPO | Admitting: Dietician

## 2023-06-29 ENCOUNTER — Other Ambulatory Visit: Payer: Self-pay | Admitting: Nurse Practitioner

## 2023-06-29 DIAGNOSIS — M4802 Spinal stenosis, cervical region: Secondary | ICD-10-CM

## 2023-06-29 DIAGNOSIS — F19982 Other psychoactive substance use, unspecified with psychoactive substance-induced sleep disorder: Secondary | ICD-10-CM

## 2023-08-10 ENCOUNTER — Other Ambulatory Visit: Payer: Self-pay | Admitting: Nurse Practitioner

## 2023-08-10 DIAGNOSIS — M1712 Unilateral primary osteoarthritis, left knee: Secondary | ICD-10-CM

## 2023-08-10 DIAGNOSIS — M4802 Spinal stenosis, cervical region: Secondary | ICD-10-CM

## 2023-08-10 DIAGNOSIS — M19012 Primary osteoarthritis, left shoulder: Secondary | ICD-10-CM

## 2023-08-10 DIAGNOSIS — I10 Essential (primary) hypertension: Secondary | ICD-10-CM

## 2023-08-10 DIAGNOSIS — F19982 Other psychoactive substance use, unspecified with psychoactive substance-induced sleep disorder: Secondary | ICD-10-CM

## 2023-08-10 NOTE — Telephone Encounter (Signed)
Please advise if ok to refill Last Ov 02/07/23

## 2023-08-15 ENCOUNTER — Other Ambulatory Visit: Payer: Self-pay | Admitting: Nurse Practitioner

## 2023-08-15 DIAGNOSIS — M19012 Primary osteoarthritis, left shoulder: Secondary | ICD-10-CM

## 2023-08-15 DIAGNOSIS — M4802 Spinal stenosis, cervical region: Secondary | ICD-10-CM

## 2023-08-15 DIAGNOSIS — M1712 Unilateral primary osteoarthritis, left knee: Secondary | ICD-10-CM

## 2023-09-11 DIAGNOSIS — Z7251 High risk heterosexual behavior: Secondary | ICD-10-CM | POA: Diagnosis not present

## 2023-09-11 DIAGNOSIS — I10 Essential (primary) hypertension: Secondary | ICD-10-CM | POA: Diagnosis not present

## 2023-11-14 ENCOUNTER — Other Ambulatory Visit: Payer: Self-pay

## 2023-11-14 ENCOUNTER — Encounter: Payer: Self-pay | Admitting: *Deleted

## 2023-11-14 ENCOUNTER — Emergency Department
Admission: EM | Admit: 2023-11-14 | Discharge: 2023-11-14 | Disposition: A | Discharge status: Home / Self Care | Payer: Federal, State, Local not specified - PPO | Attending: Emergency Medicine | Admitting: Emergency Medicine

## 2023-11-14 ENCOUNTER — Other Ambulatory Visit: Payer: Self-pay | Admitting: Nurse Practitioner

## 2023-11-14 DIAGNOSIS — I10 Essential (primary) hypertension: Secondary | ICD-10-CM

## 2023-11-14 DIAGNOSIS — M19012 Primary osteoarthritis, left shoulder: Secondary | ICD-10-CM

## 2023-11-14 DIAGNOSIS — F19982 Other psychoactive substance use, unspecified with psychoactive substance-induced sleep disorder: Secondary | ICD-10-CM

## 2023-11-14 DIAGNOSIS — M1712 Unilateral primary osteoarthritis, left knee: Secondary | ICD-10-CM

## 2023-11-14 DIAGNOSIS — Z20822 Contact with and (suspected) exposure to covid-19: Secondary | ICD-10-CM | POA: Diagnosis not present

## 2023-11-14 DIAGNOSIS — M4802 Spinal stenosis, cervical region: Secondary | ICD-10-CM

## 2023-11-14 DIAGNOSIS — J101 Influenza due to other identified influenza virus with other respiratory manifestations: Secondary | ICD-10-CM | POA: Insufficient documentation

## 2023-11-14 DIAGNOSIS — E119 Type 2 diabetes mellitus without complications: Secondary | ICD-10-CM | POA: Diagnosis not present

## 2023-11-14 DIAGNOSIS — R0981 Nasal congestion: Secondary | ICD-10-CM | POA: Diagnosis not present

## 2023-11-14 LAB — RESP PANEL BY RT-PCR (RSV, FLU A&B, COVID)  RVPGX2
Influenza A by PCR: POSITIVE — AB
Influenza B by PCR: NEGATIVE
Resp Syncytial Virus by PCR: NEGATIVE
SARS Coronavirus 2 by RT PCR: NEGATIVE

## 2023-11-14 MED ORDER — BENZONATATE 100 MG PO CAPS: 100.0000 mg | ORAL_CAPSULE | Freq: Three times a day (TID) | ORAL | 0 refills | Status: DC | PRN

## 2023-11-14 NOTE — ED Provider Triage Note (Signed)
Emergency Medicine Provider Triage Evaluation Note  Dartanian Fahrenkrug , a 58 y.o. male  was evaluated in triage.  Pt complains of nasal congestion, cough, body aches x5 days. Significant other just diagnosed with flu. No CP/SOB. No abd pain, n/v/d.  Review of Systems  Positive: nasal congestion, cough, body aches Negative: CP/SOB, abd pain, n/v/d.  Physical Exam  There were no vitals taken for this visit. Gen:   Awake, no distress   Resp:  Normal effort  MSK:   Moves extremities without difficulty  Other:    Medical Decision Making  Medically screening exam initiated at 5:13 PM.  Appropriate orders placed.  Marin [facility] [employer] was informed that the remainder of the evaluation will be completed by another provider, this initial triage assessment does not replace that evaluation, and the importance of remaining in the ED until their evaluation is complete.     Jackelyn Hoehn, PA-C 11/14/23 1714

## 2023-11-14 NOTE — ED Provider Notes (Signed)
[facility] Provider Note    Event Date/Time   First MD Initiated Contact with Patient 11/14/23 1716     (approximate)   History   Nasal Congestion and Cough   HPI  Curtis Bowman is a 58 y.o. male with PMH of HTN, diabetes, ETOH abuse, and ED presents for evaluation of URI symptoms. Patient reports cough, congestion, body aches and fever that began 4 days ago. Patient has tried using nasal spray and BC powder to manage his symptoms. He reports his girlfriend was just diagnosed with the flu.      Physical Exam   Triage Vital Signs: ED Triage Vitals [11/14/23 1714]  Encounter Vitals Group     BP      Systolic BP Percentile      Diastolic BP Percentile      Pulse      Resp      Temp      Temp src      SpO2      Weight 185 lb (83.9 kg)     Height 5\' 10"  (1.778 m)     Head Circumference      Peak Flow      Pain Score 0     Pain Loc      Pain Education      Exclude from Growth Chart     Most recent vital signs: Vitals:   11/14/23 1715  BP: (!) 192/112  Pulse: 82  Resp: 18  Temp: 98.6 F (37 C)  SpO2: 95%    General: Awake, no distress.  CV:  Good peripheral perfusion. RRR. Resp:  Normal effort. CTAB. Abd:  No distention.  Other:     ED Results / Procedures / Treatments   Labs (all labs ordered are listed, but only abnormal results are displayed) Labs Reviewed  RESP PANEL BY RT-PCR (RSV, FLU A&B, COVID)  [id number] - Abnormal; Notable for the following components:      Result Value   Influenza A by PCR POSITIVE (*)    All other components within normal limits    PROCEDURES:  Critical Care performed: No  Procedures   MEDICATIONS ORDERED IN ED: Medications - No data to display   IMPRESSION / MDM / ASSESSMENT AND PLAN / ED COURSE  I reviewed the triage vital signs and the nursing notes.                             58 year old male presents for evaluation of URI symptoms. Patient was hypertensive but does have  history of HTN, VSS otherwise. Patient NAD on exam.   Differential diagnosis includes, but is not limited to, flu, covid, rsv, bronchitis, pneumonia, strep pharyngitis.   Patient's presentation is most consistent with acute, uncomplicated illness.  Resp panel positive for Influenza A. We discussed symptomatic management.  Patient reports he has been compliant with his BP medications. He states he has an appointment with his PCP coming up, I advised him to discuss additional BP management with his PCP. I recommended OTC cold medicine specifically for people with HTN.   Patient was given a note for work. He was agreeable to plan, voiced understanding and was stable at discharge.      FINAL CLINICAL IMPRESSION(S) / ED DIAGNOSES   Final diagnoses:  Influenza A     Rx / DC Orders   ED Discharge Orders  Ordered    benzonatate (TESSALON PERLES) 100 MG capsule  3 times daily PRN        11/14/23 1738             Note:  This document was prepared using Dragon voice recognition software and may include unintentional dictation errors.   Cameron Ali, PA-C 11/14/23 1818    Merwyn Katos, MD 11/14/23 2200

## 2023-11-14 NOTE — Discharge Instructions (Addendum)
You tested positive for influenza A today.   Avoid using BC powder to treat your pain as this contains a high dose of aspirin and can lead to bleeding. Please take tylenol or ibuprofen instead. You can take 650 mg of Tylenol and 600 mg of ibuprofen every 6 hours as needed for pain.  Look for Corcidin HBP at the pharmacy. This is an over the counter cold medicine specifically for patient's with high blood pressure. I have sent a medication called Tessalon Perles, this is for cough. It can be taken 3 times daily as needed.  Please schedule a follow up appointment with your primary care provider to discuss further management of your blood pressure which was elevated today. Please continue taking your blood pressure medication as prescribed.

## 2023-11-14 NOTE — ED Triage Notes (Signed)
Pt ambulatory to triage.  Pt has a cough, nasal congestion for 4 days.  Girlfriend has the flu per pt.   Pt alert.

## 2023-11-16 ENCOUNTER — Other Ambulatory Visit: Payer: Self-pay | Admitting: Nurse Practitioner

## 2023-11-16 DIAGNOSIS — I10 Essential (primary) hypertension: Secondary | ICD-10-CM

## 2023-11-20 ENCOUNTER — Telehealth: Payer: Self-pay

## 2023-11-20 NOTE — Progress Notes (Signed)
Transition Care Management Follow-up Telephone Call Date of discharge and from where: 11/14/2023 [facility] How have you been since you were released from the hospital? Patient stated she is feeling much better. Any questions or concerns? No  Items Reviewed: Did the pt receive and understand the discharge instructions provided? Yes  Medications obtained and verified? Yes  Other? No  Any new allergies since your discharge? No  Dietary orders reviewed? Yes Do you have support at home? Yes   Follow up appointments reviewed:  PCP Hospital f/u appt confirmed? No  Scheduled to see  on  @ . Specialist Hospital f/u appt confirmed? No  Scheduled to see  on  @ . Are transportation arrangements needed? No  If their condition worsens, is the pt aware to call PCP or go to the Emergency Dept.? Yes Was the patient provided with contact information for the PCP's office or ED? Yes Was to pt encouraged to call back with questions or concerns? Yes   Kiven Vangilder [facility]  [facility] Guide Direct Dial: [phone]  Fax: [phone] Website: [email or web address]

## 2023-11-29 ENCOUNTER — Other Ambulatory Visit: Payer: Self-pay | Admitting: Nurse Practitioner

## 2023-11-29 DIAGNOSIS — E781 Pure hyperglyceridemia: Secondary | ICD-10-CM

## 2023-11-29 DIAGNOSIS — M19012 Primary osteoarthritis, left shoulder: Secondary | ICD-10-CM

## 2023-11-29 DIAGNOSIS — I10 Essential (primary) hypertension: Secondary | ICD-10-CM

## 2023-11-29 DIAGNOSIS — M4802 Spinal stenosis, cervical region: Secondary | ICD-10-CM

## 2023-11-29 DIAGNOSIS — F19982 Other psychoactive substance use, unspecified with psychoactive substance-induced sleep disorder: Secondary | ICD-10-CM

## 2023-11-29 DIAGNOSIS — M1712 Unilateral primary osteoarthritis, left knee: Secondary | ICD-10-CM

## 2023-11-29 MED ORDER — FENOFIBRATE 145 MG PO TABS: 145.0000 mg | ORAL_TABLET | Freq: Every day | ORAL | 0 refills | Status: DC

## 2023-11-29 MED ORDER — CYCLOBENZAPRINE HCL 5 MG PO TABS: 5.0000 mg | ORAL_TABLET | Freq: Every evening | ORAL | 0 refills | Status: DC | PRN

## 2023-11-29 MED ORDER — MELOXICAM 7.5 MG PO TABS: 7.5000 mg | ORAL_TABLET | Freq: Every day | ORAL | 0 refills | Status: DC

## 2023-11-29 MED ORDER — LOSARTAN POTASSIUM 50 MG PO TABS: 50.0000 mg | ORAL_TABLET | Freq: Every day | ORAL | 0 refills | Status: DC

## 2023-11-29 MED ORDER — TRAZODONE HCL 50 MG PO TABS: 25.0000 mg | ORAL_TABLET | Freq: Every evening | ORAL | 0 refills | Status: DC | PRN

## 2023-11-29 NOTE — Telephone Encounter (Signed)
 Copied from CRM [id number]. Topic: Clinical - Medication Refill >> Nov 29, 2023 12:29 PM Laurier C wrote: Most Recent Primary Care Visit:  Provider: KATHEEN ROSELIE ROCKFORD  Department: [facility]  Visit Type: OFFICE VISIT  Date: 02/07/2023  Medication:  cyclobenzaprine  (FLEXERIL ) 5 MG tablet fenofibrate  (TRICOR ) 145 MG tablet losartan  (COZAAR ) 50 MG tablet meloxicam  (MOBIC ) 7.5 MG tablet traZODone  (DESYREL ) 50 MG tablet  Has the patient contacted their pharmacy? Yes (Agent: If no, request that the patient contact the pharmacy for the refill. If patient does not wish to contact the pharmacy document the reason why and proceed with request.) (Agent: If yes, when and what did the pharmacy advise?)  Is this the correct pharmacy for this prescription? Yes If no, delete pharmacy and type the correct one.  This is the patient's preferred pharmacy:  [id number] [place], [place] - [address] [address] Phone: [phone] Fax: [phone]  Has the prescription been filled recently? No  Is the patient out of the medication? Yes  Has the patient been seen for an appointment in the last year OR does the patient have an upcoming appointment? No  Can we respond through MyChart? No  Agent: Please be advised that Rx refills may take up to 3 business days. We ask that you follow-up with your pharmacy.

## 2023-11-29 NOTE — Telephone Encounter (Signed)
 This RN called patient regarding medication refill. Patient requesting refills on meds Flexeril  5 mg, Mobic  7.5 mg, and Desyrel  50 mg which require office visits for refill. This RN scheduled patient appt for 12/04/23. Patients states he is out of his Losartan .

## 2023-12-04 ENCOUNTER — Encounter: Payer: Self-pay | Admitting: Nurse Practitioner

## 2023-12-04 ENCOUNTER — Ambulatory Visit: Payer: Federal, State, Local not specified - PPO | Admitting: Nurse Practitioner

## 2023-12-04 VITALS — BP 180/100 | HR 73 | Temp 98.1°F | Ht 70.0 in | Wt 177.8 lb

## 2023-12-04 DIAGNOSIS — Z23 Encounter for immunization: Secondary | ICD-10-CM | POA: Diagnosis not present

## 2023-12-04 DIAGNOSIS — M4802 Spinal stenosis, cervical region: Secondary | ICD-10-CM | POA: Diagnosis not present

## 2023-12-04 DIAGNOSIS — M19012 Primary osteoarthritis, left shoulder: Secondary | ICD-10-CM

## 2023-12-04 DIAGNOSIS — F19982 Other psychoactive substance use, unspecified with psychoactive substance-induced sleep disorder: Secondary | ICD-10-CM

## 2023-12-04 DIAGNOSIS — N521 Erectile dysfunction due to diseases classified elsewhere: Secondary | ICD-10-CM

## 2023-12-04 DIAGNOSIS — I10 Essential (primary) hypertension: Secondary | ICD-10-CM | POA: Diagnosis not present

## 2023-12-04 DIAGNOSIS — Z1211 Encounter for screening for malignant neoplasm of colon: Secondary | ICD-10-CM

## 2023-12-04 DIAGNOSIS — M1712 Unilateral primary osteoarthritis, left knee: Secondary | ICD-10-CM

## 2023-12-04 DIAGNOSIS — F1011 Alcohol abuse, in remission: Secondary | ICD-10-CM | POA: Diagnosis not present

## 2023-12-04 DIAGNOSIS — R7303 Prediabetes: Secondary | ICD-10-CM | POA: Diagnosis not present

## 2023-12-04 DIAGNOSIS — E119 Type 2 diabetes mellitus without complications: Secondary | ICD-10-CM | POA: Insufficient documentation

## 2023-12-04 DIAGNOSIS — E781 Pure hyperglyceridemia: Secondary | ICD-10-CM | POA: Diagnosis not present

## 2023-12-04 LAB — COMPREHENSIVE METABOLIC PANEL
ALT: 17 U/L (ref 0–53)
AST: 20 U/L (ref 0–37)
Albumin: 4.7 g/dL (ref 3.5–5.2)
Alkaline Phosphatase: 64 U/L (ref 39–117)
BUN: 21 mg/dL (ref 6–23)
CO2: 28 meq/L (ref 19–32)
Calcium: 8.7 mg/dL (ref 8.4–10.5)
Chloride: 100 meq/L (ref 96–112)
Creatinine, Ser: 0.99 mg/dL (ref 0.40–1.50)
GFR: 83.69 mL/min (ref >60.00)
Glucose, Bld: 104 mg/dL — ABNORMAL HIGH (ref 70–99)
Potassium: 3.7 meq/L (ref 3.5–5.1)
Sodium: 137 meq/L (ref 135–145)
Total Bilirubin: 0.5 mg/dL (ref 0.2–1.2)
Total Protein: 7.6 g/dL (ref 6.0–8.3)

## 2023-12-04 LAB — LIPID PANEL
Cholesterol: 194 mg/dL (ref 0–200)
HDL: 43.8 mg/dL (ref >39.00)
LDL Cholesterol: 120 mg/dL — ABNORMAL HIGH (ref 0–99)
NonHDL: 150.14
Total CHOL/HDL Ratio: 4
Triglycerides: 150 mg/dL — ABNORMAL HIGH (ref 0.0–149.0)
VLDL: 30 mg/dL (ref 0.0–40.0)

## 2023-12-04 LAB — POCT GLYCOSYLATED HEMOGLOBIN (HGB A1C)
HbA1c POC (<> result, manual entry): 6 % (ref 4.0–5.6)
HbA1c, POC (controlled diabetic range): 6 % (ref 0.0–7.0)
HbA1c, POC (prediabetic range): 6 % (ref 5.7–6.4)
Hemoglobin A1C: 6 % — AB (ref 4.0–5.6)

## 2023-12-04 MED ORDER — CYCLOBENZAPRINE HCL 5 MG PO TABS: 5.0000 mg | ORAL_TABLET | Freq: Every day | ORAL | 5 refills | Status: AC

## 2023-12-04 MED ORDER — MELOXICAM 15 MG PO TABS: 15.0000 mg | ORAL_TABLET | Freq: Every day | ORAL | 0 refills | Status: DC

## 2023-12-04 MED ORDER — SILDENAFIL CITRATE 50 MG PO TABS: 50.0000 mg | ORAL_TABLET | Freq: Every day | ORAL | 0 refills | Status: AC | PRN

## 2023-12-04 MED ORDER — LOSARTAN POTASSIUM-HCTZ 100-12.5 MG PO TABS: 1.0000 | ORAL_TABLET | Freq: Every day | ORAL | 1 refills | Status: DC

## 2023-12-04 NOTE — Assessment & Plan Note (Addendum)
 BP not at goal due to med dose non compliance He quit ETOH and tobacco use BP Readings from Last 3 Encounters:  12/04/23 (!) 180/100  11/14/23 (!) 192/112  02/07/23 (!) 120/100    Change losartan  to losartan /hydrochlorothiazide 100/12.5mg  Repeat CMP. Advised to maintain low salt diet F/up in 2weeks

## 2023-12-04 NOTE — Assessment & Plan Note (Addendum)
 Persistent ED despite discontinuation of ALCOHOL and nicotine. Sent viagra  50mg  Refer to urology if no improvement

## 2023-12-04 NOTE — Assessment & Plan Note (Signed)
 Quit 07/2023. Denies any withdrawal symptoms

## 2023-12-04 NOTE — Progress Notes (Signed)
 Established Patient Visit  Patient: Curtis Bowman   DOB: [birth year]   58 y.o. Male  MRN: [id number] Visit Date: 12/04/2023  Subjective:    Chief Complaint  Patient presents with   Medication Management    Rx Refills, Request a Rx of Cialis or Viagra , HTN medication not helping   HPI History of ETOH abuse Quit 07/2023. Denies any withdrawal symptoms  Primary hypertension BP not at goal due to med dose non compliance He quit ETOH and tobacco use BP Readings from Last 3 Encounters:  12/04/23 (!) 180/100  11/14/23 (!) 192/112  02/07/23 (!) 120/100    Change losartan  to losartan /hydrochlorothiazide 100/12.5mg  Repeat CMP. Advised to maintain low salt diet F/up in 2weeks  Erectile dysfunction Persistent ED despite discontinuation of ALCOHOL and nicotine. Sent viagra  50mg  Refer to urology if no improvement  Prediabetes hgbA1c at 6.6% 1year ago. Repeat hgbA1c today: 6.0% Advised to maintain heart healthy diet  Cervical stenosis of spinal canal Chronic, No change Pain is managed with mobic  and muscle relaxant daily   Reviewed medical, surgical, and social history today  Medications: Outpatient Medications Prior to Visit  Medication Sig   fenofibrate  (TRICOR ) 145 MG tablet Take 1 tablet (145 mg total) by mouth daily. No additional refills without appointment with pcp   [DISCONTINUED] benzonatate  (TESSALON  PERLES) 100 MG capsule Take 1 capsule (100 mg total) by mouth 3 (three) times daily as needed for cough.   [DISCONTINUED] cyclobenzaprine  (FLEXERIL ) 5 MG tablet Take 1 tablet (5 mg total) by mouth at bedtime as needed for muscle spasms. No additional refills without appointment with pcp   [DISCONTINUED] losartan  (COZAAR ) 50 MG tablet Take 1 tablet (50 mg total) by mouth daily. No additional refills without appointment with pcp   [DISCONTINUED] meloxicam  (MOBIC ) 7.5 MG tablet Take 1 tablet (7.5 mg total) by mouth daily. with food. No additional refills  without appointment with pcp   [DISCONTINUED] traZODone  (DESYREL ) 50 MG tablet Take 0.5-1 tablets (25-50 mg total) by mouth at bedtime as needed for sleep. No additional refills without appointment with pcp   No facility-administered medications prior to visit.   Reviewed past medical and social history.   ROS per HPI above      Objective:  BP (!) 180/100 (BP Location: Left Arm, Patient Position: Supine, Cuff Size: Normal)   Pulse 73   Temp 98.1 F (36.7 C)   Ht 5\' 10"  (1.778 m)   Wt 177 lb 12.8 oz (80.6 kg)   SpO2 99%   BMI 25.51 kg/m      Physical Exam Vitals and nursing note reviewed.  Cardiovascular:     Rate and Rhythm: Normal rate and regular rhythm.     Pulses: Normal pulses.     Heart sounds: Normal heart sounds.  Pulmonary:     Effort: Pulmonary effort is normal.     Breath sounds: Normal breath sounds.  Musculoskeletal:     Right lower leg: No edema.     Left lower leg: No edema.  Neurological:     Mental Status: He is alert and oriented to person, place, and time.     Results for orders placed or performed in visit on 12/04/23  POCT glycosylated hemoglobin (Hb A1C)  Result Value Ref Range   Hemoglobin A1C 6.0 (A) 4.0 - 5.6 %   HbA1c POC (<> result, manual entry) 6.0 4.0 - 5.6 %   HbA1c, POC (prediabetic  range) 6.0 5.7 - 6.4 %   HbA1c, POC (controlled diabetic range) 6.0 0.0 - 7.0 %      Assessment & Plan:    Problem List Items Addressed This Visit     Arthritis of knee, left   Relevant Medications   cyclobenzaprine  (FLEXERIL ) 5 MG tablet   meloxicam  (MOBIC ) 15 MG tablet   Cervical stenosis of spinal canal   Chronic, No change Pain is managed with mobic  and muscle relaxant daily      Relevant Medications   cyclobenzaprine  (FLEXERIL ) 5 MG tablet   meloxicam  (MOBIC ) 15 MG tablet   Erectile dysfunction   Persistent ED despite discontinuation of ALCOHOL and nicotine. Sent viagra  50mg  Refer to urology if no improvement      Relevant  Medications   sildenafil  (VIAGRA ) 50 MG tablet   History of ETOH abuse - Primary   Quit 07/2023. Denies any withdrawal symptoms      Prediabetes   hgbA1c at 6.6% 1year ago. Repeat hgbA1c today: 6.0% Advised to maintain heart healthy diet      Relevant Orders   POCT glycosylated hemoglobin (Hb A1C) (Completed)   Microalbumin / creatinine urine ratio   Primary hypertension   BP not at goal due to med dose non compliance He quit ETOH and tobacco use BP Readings from Last 3 Encounters:  12/04/23 (!) 180/100  11/14/23 (!) 192/112  02/07/23 (!) 120/100    Change losartan  to losartan /hydrochlorothiazide 100/12.5mg  Repeat CMP. Advised to maintain low salt diet F/up in 2weeks      Relevant Medications   losartan -hydrochlorothiazide (HYZAAR) 100-12.5 MG tablet   sildenafil  (VIAGRA ) 50 MG tablet   Other Relevant Orders   Comprehensive metabolic panel   Primary osteoarthritis of left shoulder   Relevant Medications   cyclobenzaprine  (FLEXERIL ) 5 MG tablet   meloxicam  (MOBIC ) 15 MG tablet   Other Visit Diagnoses       Hypertriglyceridemia       Relevant Medications   losartan -hydrochlorothiazide (HYZAAR) 100-12.5 MG tablet   sildenafil  (VIAGRA ) 50 MG tablet   Other Relevant Orders   Comprehensive metabolic panel   Lipid panel     Drug-induced insomnia (HCC)         Immunization due       Relevant Orders   Zoster Recombinant (Shingrix  ) (Completed)   Pneumococcal conjugate vaccine 20-valent (Prevnar 20) (Completed)     Colon cancer screening       Relevant Orders   Ambulatory referral to Gastroenterology      Return in about 2 weeks (around 12/18/2023) for HTN.     Kathrene Parents, NP

## 2023-12-04 NOTE — Assessment & Plan Note (Addendum)
 Chronic, No change Pain is managed with mobic  and muscle relaxant daily

## 2023-12-04 NOTE — Assessment & Plan Note (Addendum)
 hgbA1c at 6.6% 1year ago. Repeat hgbA1c today: 6.0% Advised to maintain heart healthy diet

## 2023-12-04 NOTE — Patient Instructions (Addendum)
 Go to lab Resume BP medication. Maintain DASH diet. HgbA1c at 6.0%: improved from 6.6%.  DASH Eating Plan DASH stands for Dietary Approaches to Stop Hypertension. The DASH eating plan is a healthy eating plan that has been shown to: Lower high blood pressure (hypertension). Reduce your risk for type 2 diabetes, heart disease, and stroke. Help with weight loss. What are tips for following this plan? Reading food labels Check food labels for the amount of salt (sodium) per serving. Choose foods with less than 5 percent of the Daily Value (DV) of sodium. In [place], foods with less than 300 milligrams (mg) of sodium per serving fit into this eating plan. To find whole grains, look for the word "whole" as the first word in the ingredient list. Shopping Buy products labeled as "low-sodium" or "no salt added." Buy fresh foods. Avoid canned foods and pre-made or frozen meals. Cooking Try not to add salt when you cook. Use salt-free seasonings or herbs instead of table salt or sea salt. Check with your health care provider or pharmacist before using salt substitutes. Do not fry foods. Cook foods in healthy ways, such as baking, boiling, grilling, roasting, or broiling. Cook using oils that are good for your heart. These include olive, canola, avocado, soybean, and sunflower oil. Meal planning  Eat a balanced diet. This should include: 4 or more servings of fruits and 4 or more servings of vegetables each day. Try to fill half of your plate with fruits and vegetables. 6-8 servings of whole grains each day. 6 or less servings of lean meat, poultry, or fish each day. 1 oz is 1 serving. A 3 oz (85 g) serving of meat is about the same size as the palm of your hand. One egg is 1 oz (28 g). 2-3 servings of low-fat dairy each day. One serving is 1 cup (237 mL). 1 serving of nuts, seeds, or beans 5 times each week. 2-3 servings of heart-healthy fats. Healthy fats called omega-3 fatty acids are found in  foods such as walnuts, flaxseeds, fortified milks, and eggs. These fats are also found in cold-water fish, such as sardines, salmon, and mackerel. Limit how much you eat of: Canned or prepackaged foods. Food that is high in trans fat, such as fried foods. Food that is high in saturated fat, such as fatty meat. Desserts and other sweets, sugary drinks, and other foods with added sugar. Full-fat dairy products. Do not salt foods before eating. Do not eat more than 4 egg yolks a week. Try to eat at least 2 vegetarian meals a week. Eat more home-cooked food and less restaurant, buffet, and fast food. Lifestyle When eating at a restaurant, ask if your food can be made with less salt or no salt. If you drink alcohol: Limit how much you have to: 0-1 drink a day if you are male. 0-2 drinks a day if you are male. Know how much alcohol is in your drink. In the [place] one drink is one 12 oz bottle of beer (355 mL), one 5 oz glass of wine (148 mL), or one 1 oz glass of hard liquor (44 mL). [place]man information Avoid eating more than 2,300 mg of salt a day. If you have hypertension, you may need to reduce your sodium intake to 1,500 mg a day. Work with your provider to stay at a healthy body weight or lose weight. Ask what the best weight range is for you. On most days of the week, get at least 30  minutes of exercise that causes your heart to beat faster. This may include walking, swimming, or biking. Work with your provider or dietitian to adjust your eating plan to meet your specific calorie needs. What foods should I eat? Fruits All fresh, dried, or frozen fruit. Canned fruits that are in their natural juice and do not have sugar added to them. Vegetables Fresh or frozen vegetables that are raw, steamed, roasted, or grilled. Low-sodium or reduced-sodium tomato and vegetable juice. Low-sodium or reduced-sodium tomato sauce and tomato paste. Low-sodium or reduced-sodium canned  vegetables. Grains Whole-grain or whole-wheat bread. Whole-grain or whole-wheat pasta. Brown rice. Curtis Bowman. Bulgur. Whole-grain and low-sodium cereals. Pita bread. Low-fat, low-sodium crackers. Whole-wheat flour tortillas. Meats and other proteins Skinless chicken or [place]. Ground chicken or [place]. Pork with fat trimmed off. Fish and seafood. Egg whites. Dried beans, peas, or lentils. Unsalted nuts, nut butters, and seeds. Unsalted canned beans. Lean cuts of beef with fat trimmed off. Low-sodium, lean precooked or cured meat, such as sausages or meat loaves. Dairy Low-fat (1%) or fat-free (skim) milk. Reduced-fat, low-fat, or fat-free cheeses. Nonfat, low-sodium ricotta or cottage cheese. Low-fat or nonfat yogurt. Low-fat, low-sodium cheese. Fats and oils Soft margarine without trans fats. Vegetable oil. Reduced-fat, low-fat, or light mayonnaise and salad dressings (reduced-sodium). Canola, safflower, olive, avocado, soybean, and sunflower oils. Avocado. Seasonings and condiments Herbs. Spices. Seasoning mixes without salt. Other foods Unsalted popcorn and pretzels. Fat-free sweets. The items listed above may not be all the foods and drinks you can have. Talk to a dietitian to learn more. What foods should I avoid? Fruits Canned fruit in a light or heavy syrup. Fried fruit. Fruit in cream or butter sauce. Vegetables Creamed or fried vegetables. Vegetables in a cheese sauce. Regular canned vegetables that are not marked as low-sodium or reduced-sodium. Regular canned tomato sauce and paste that are not marked as low-sodium or reduced-sodium. Regular tomato and vegetable juices that are not marked as low-sodium or reduced-sodium. Curtis Bowman. Olives. Grains Baked goods made with fat, such as croissants, muffins, or some breads. Dry pasta or rice meal packs. Meats and other proteins Fatty cuts of meat. Ribs. Fried meat. Curtis Bowman. Bologna, salami, and other precooked or cured meats, such as  sausages or meat loaves, that are not lean and low in sodium. Fat from the back of a pig (fatback). Bratwurst. Salted nuts and seeds. Canned beans with added salt. Canned or smoked fish. Whole eggs or egg yolks. Chicken or [place] with skin. Dairy Whole or 2% milk, cream, and half-and-half. Whole or full-fat cream cheese. Whole-fat or sweetened yogurt. Full-fat cheese. Nondairy creamers. Whipped toppings. Processed cheese and cheese spreads. Fats and oils Butter. Stick margarine. Lard. Shortening. Ghee. Bacon fat. Tropical oils, such as coconut, palm kernel, or palm oil. Seasonings and condiments Onion salt, garlic salt, seasoned salt, table salt, and sea salt. Worcestershire sauce. Tartar sauce. Barbecue sauce. Teriyaki sauce. Soy sauce, including reduced-sodium soy sauce. Steak sauce. Canned and packaged gravies. Fish sauce. Oyster sauce. Cocktail sauce. Store-bought horseradish. Ketchup. Mustard. Meat flavorings and tenderizers. Bouillon cubes. Hot sauces. Pre-made or packaged marinades. Pre-made or packaged taco seasonings. Relishes. Regular salad dressings. Other foods Salted popcorn and pretzels. The items listed above may not be all the foods and drinks you should avoid. Talk to a dietitian to learn more. Where to find more information National Heart, Lung, and Blood Institute (NHLBI): BuffaloDryCleaner.gl American Heart Association (AHA): heart.org Academy of Nutrition and Dietetics: eatright.org National Kidney Foundation (NKF): kidney.org This information  is not intended to replace advice given to you by your health care provider. Make sure you discuss any questions you have with your health care provider. Document Revised: 10/27/2022 Document Reviewed: 10/27/2022 Elsevier Patient Education  2024 [employer].

## 2023-12-05 ENCOUNTER — Encounter: Payer: Self-pay | Admitting: Nurse Practitioner

## 2023-12-05 LAB — MICROALBUMIN / CREATININE URINE RATIO
Creatinine,U: 118.1 mg/dL
Microalb Creat Ratio: 50.4 mg/g — ABNORMAL HIGH (ref 0.0–30.0)
Microalb, Ur: 5.9 mg/dL — ABNORMAL HIGH (ref 0.0–1.9)

## 2023-12-18 ENCOUNTER — Ambulatory Visit: Payer: Federal, State, Local not specified - PPO | Admitting: Nurse Practitioner

## 2023-12-29 ENCOUNTER — Telehealth: Payer: Self-pay | Admitting: Nurse Practitioner

## 2023-12-29 NOTE — Telephone Encounter (Signed)
 12/18/2023 same day cancel, 1st missed visit since 01/2022, letter sent via [facility]

## 2023-12-31 ENCOUNTER — Other Ambulatory Visit: Payer: Self-pay | Admitting: Nurse Practitioner

## 2023-12-31 DIAGNOSIS — M4802 Spinal stenosis, cervical region: Secondary | ICD-10-CM

## 2023-12-31 DIAGNOSIS — M1712 Unilateral primary osteoarthritis, left knee: Secondary | ICD-10-CM

## 2023-12-31 DIAGNOSIS — M19012 Primary osteoarthritis, left shoulder: Secondary | ICD-10-CM

## 2024-01-09 ENCOUNTER — Ambulatory Visit: Payer: Self-pay | Admitting: Nurse Practitioner

## 2024-01-18 ENCOUNTER — Telehealth: Payer: Self-pay | Admitting: Nurse Practitioner

## 2024-01-18 ENCOUNTER — Encounter: Payer: Self-pay | Admitting: Nurse Practitioner

## 2024-01-18 NOTE — Telephone Encounter (Signed)
 See note

## 2024-02-01 ENCOUNTER — Other Ambulatory Visit: Payer: Self-pay | Admitting: Nurse Practitioner

## 2024-02-01 DIAGNOSIS — M4802 Spinal stenosis, cervical region: Secondary | ICD-10-CM

## 2024-02-01 DIAGNOSIS — M1712 Unilateral primary osteoarthritis, left knee: Secondary | ICD-10-CM

## 2024-02-01 DIAGNOSIS — M19012 Primary osteoarthritis, left shoulder: Secondary | ICD-10-CM

## 2024-02-14 ENCOUNTER — Other Ambulatory Visit: Payer: Self-pay | Admitting: Nurse Practitioner

## 2024-02-14 DIAGNOSIS — M19012 Primary osteoarthritis, left shoulder: Secondary | ICD-10-CM

## 2024-02-14 DIAGNOSIS — M4802 Spinal stenosis, cervical region: Secondary | ICD-10-CM

## 2024-02-14 DIAGNOSIS — M1712 Unilateral primary osteoarthritis, left knee: Secondary | ICD-10-CM

## 2024-02-14 DIAGNOSIS — N521 Erectile dysfunction due to diseases classified elsewhere: Secondary | ICD-10-CM

## 2024-02-14 NOTE — Telephone Encounter (Signed)
 Prescription Request Medication:  Sildenafil  Directions:1 Tablet by mouth daily as needed for erectile dysfunction Last given: 12/04/2023 Number refills: 0 Last o/v: 12/04/2023 Follow up:2 week f/u (around 12/18/2023) Labs: 12/04/2023   Medication: Meloxicam  Directions: 1 Tablet by mouth everyday with food Last given: 01/04/2024 Number refills: 0 Last o/v: 12/04/2023 Follow up:2 week f/u (around 12/18/2023) Labs: 12/04/2023

## 2024-03-20 ENCOUNTER — Other Ambulatory Visit: Payer: Self-pay | Admitting: Nurse Practitioner

## 2024-03-20 DIAGNOSIS — M1712 Unilateral primary osteoarthritis, left knee: Secondary | ICD-10-CM

## 2024-03-20 DIAGNOSIS — M4802 Spinal stenosis, cervical region: Secondary | ICD-10-CM

## 2024-03-20 DIAGNOSIS — M19012 Primary osteoarthritis, left shoulder: Secondary | ICD-10-CM

## 2024-03-20 NOTE — Telephone Encounter (Signed)
 Medication: Meloxicam  15 mg  Directions: Take 1 tablet by mouth daily  Last given: 02/14/24 Number refills: 0 Last o/v: 12/04/23 Follow up: 2 weeks-NOT rescheduled  Labs: 12/04/23

## 2024-08-29 ENCOUNTER — Ambulatory Visit
Admission: EM | Admit: 2024-08-29 | Discharge: 2024-08-29 | Disposition: A | Discharge status: Home / Self Care | Attending: Emergency Medicine | Admitting: Emergency Medicine

## 2024-08-29 DIAGNOSIS — K130 Diseases of lips: Secondary | ICD-10-CM | POA: Diagnosis not present

## 2024-08-29 DIAGNOSIS — Z760 Encounter for issue of repeat prescription: Secondary | ICD-10-CM

## 2024-08-29 DIAGNOSIS — I10 Essential (primary) hypertension: Secondary | ICD-10-CM | POA: Diagnosis not present

## 2024-08-29 HISTORY — DX: Essential (primary) hypertension: I10

## 2024-08-29 MED ORDER — DOCOSANOL 10 % EX CREA: 1.0000 | TOPICAL_CREAM | Freq: Every day | CUTANEOUS | 0 refills | Status: AC

## 2024-08-29 MED ORDER — LOSARTAN POTASSIUM-HCTZ 100-12.5 MG PO TABS
1.0000 | ORAL_TABLET | Freq: Every day | ORAL | 0 refills | Status: AC
Start: 2024-08-29 — End: ?

## 2024-08-29 NOTE — Discharge Instructions (Addendum)
 Your blood pressure is elevated today at 180/79.  Please have this rechecked by your current primary care provider this week.  A 30-day supply of your blood pressure medicine has been sent to your pharmacy.  Please have your current primary care provider provide additional refills.    We have scheduled an appointment for you to establish a new PCP here in [place].  See appointment date and time listed below.  Use Abreva cream as directed for the lip sore.

## 2024-08-29 NOTE — ED Triage Notes (Signed)
 Patient to Urgent Care with complaints of a lower lip lesion. Reports symptoms started after he was shaved with a straight razor.  Symptoms x4 days.

## 2024-08-29 NOTE — ED Provider Notes (Signed)
 Curtis Bowman    CSN: [id number] Arrival date & time: 08/29/24  9147      History   Chief Complaint Chief Complaint  Patient presents with   Mouth Lesions    HPI Curtis Bowman is a 58 y.o. male.  Patient presents with 4-day history of a sore on his left lower lip.  This started after he went to a barber and had a shave with a straight razor.  It started as 2 bumps but has become a cluster of bumps now.  No fever or purulent drainage.   Patient has history of hypertension.  He has not taken his blood pressure medicine in more than 6 months and does not currently have any blood pressure medicine available.  He has a PCP in [place] but would like to change to a PCP here in [place] as the distance to [place] is burdensome.  He reports no chest pain, shortness of breath, focal weakness, numbness, dizziness, headache.  The history is provided by the patient and medical records.    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 No pertinent surgical history.     Home Medications    Prior to Admission medications   Medication Sig Start Date End Date Taking? Authorizing Provider  Docosanol (ABREVA) 10 % CREA Apply 1 Application topically 5 (five) times daily. 08/29/24  Yes Corlis Burnard DEL, NP  cyclobenzaprine  (FLEXERIL ) 5 MG tablet Take 1 tablet (5 mg total) by mouth at bedtime. Patient not taking: Reported on 08/29/2024 12/04/23   Nche, Roselie Rockford, NP  fenofibrate  (TRICOR ) 145 MG tablet Take 1 tablet (145 mg total) by mouth daily. No additional refills without  appointment with pcp 11/29/23   Nche, Roselie Rockford, NP  losartan -hydrochlorothiazide (HYZAAR) 100-12.5 MG tablet Take 1 tablet by mouth daily. 08/29/24   Corlis Burnard DEL, NP  meloxicam  (MOBIC ) 15 MG tablet TAKE 1 TABLET BY MOUTH EVERY DAY WITH FOOD Patient not taking: Reported on 08/29/2024 02/14/24   Nche, Roselie Rockford, NP  sildenafil  (VIAGRA ) 50 MG tablet Take 1 tablet (50 mg total) by mouth daily as needed for erectile dysfunction. 12/04/23   Nche, Roselie Rockford, NP    Family History Family History  Problem Relation Age of Onset   Diabetes Mother    Hypertension Mother    Congestive Heart Failure Mother    Hypertension Father    Hypertension Sister    Diabetes Sister    Hypertension Brother    Diabetes Brother    Stroke Paternal Uncle    Hypertension Paternal Uncle    Heart disease Paternal Grandfather     Social History Social History   Tobacco Use   Smoking status: Never   Smokeless tobacco: Never  Vaping Use   Vaping status: Never Used  Substance Use Topics   Alcohol use: Not Currently   Drug use: Not Currently    Frequency: 7.0 times per week    Types: Marijuana     Allergies   Patient has no known allergies.   Review of Systems Review of Systems  Constitutional:  Negative for chills and fever.  HENT:  Positive for mouth sores. Negative for sore throat, trouble swallowing and voice change.   Respiratory:  Negative for cough and shortness of breath.   Cardiovascular:  Negative for chest pain and palpitations.  Neurological:  Negative for syncope, facial asymmetry, speech difficulty, weakness, light-headedness, numbness and headaches.     Physical Exam Triage Vital Signs ED Triage Vitals  Encounter Vitals Group     BP 08/29/24 0912 (!) 180/79     Girls Systolic BP Percentile --      Girls Diastolic BP Percentile --      Boys Systolic BP Percentile --      Boys Diastolic BP Percentile --      Pulse Rate 08/29/24 0906 80     Resp 08/29/24 0906 18     Temp  08/29/24 0906 97.7 F (36.5 C)     Temp src --      SpO2 08/29/24 0906 99 %     Weight --      Height --      Head Circumference --      Peak Flow --      Pain Score 08/29/24 0908 0     Pain Loc --      Pain Education --      Exclude from Growth Chart --    No data found.  Updated Vital Signs BP (!) 179/97   Pulse 80   Temp 97.7 F (36.5 C)   Resp 18   SpO2 99%   Visual Acuity Right Eye Distance:   Left Eye Distance:   Bilateral Distance:    Right Eye Near:   Left Eye Near:    Bilateral Near:     Physical Exam Constitutional:      General: He is not in acute distress. HENT:     Mouth/Throat:     Mouth: Mucous membranes are moist.     Pharynx: Oropharynx is clear.     Comments: Small cluster of papules on left external lower lip.  No drainage. Cardiovascular:     Rate and Rhythm: Normal rate and regular rhythm.     Heart sounds: Normal heart sounds.  Pulmonary:     Effort: Pulmonary effort is normal. No respiratory distress.     Breath sounds: Normal breath sounds.  Neurological:     General: No focal deficit present.     Mental Status: He is alert and oriented to person, place, and time.     Sensory: No sensory deficit.     Motor: No weakness.     Gait: Gait normal.      UC Treatments / Results  Labs (all labs ordered are listed, but only abnormal results are displayed) Labs Reviewed - No data to display  EKG   Radiology No results found.  Procedures Procedures (including critical care time)  Medications Ordered in UC Medications - No data to display  Initial Impression / Assessment and Plan / UC Course  I have reviewed the triage vital signs and the nursing notes.  Pertinent labs & imaging results that were available during my care of the patient were reviewed by me and considered in my medical decision making (see chart for details).    Elevated blood pressure reading with hypertension, medication refill, lip ulcer.  Blood pressure  elevated at 180/79; repeat 179/97.  Review of chart shows similar readings multiple times over the last year.  Patient is not taking his blood pressure medication  and does not have any available.  He has a PCP in [place] but finds the travel distance burdensome and would like to establish a PCP here in [place].  Appointment scheduled for patient to establish at [facility] family practice on 10/23/2024.  A 30-day supply of his blood pressure medicine sent to his pharmacy today.  Discussed with patient that he will need to follow-up with his current PCP while awaiting this appointment.  Discussed risk for cardiovascular event such as stroke with his blood pressure being as high as it is.  Instructed him to call his PCPs office this afternoon to schedule an appointment to be seen tomorrow.  Instructed him to start on his blood pressure medicine today when he picks it up from the pharmacy.  Education provided on managing hypertension.  For his lip ulcer, treating today with Abreva.  Education provided on lip ulcer.  Patient declines HSV swab.  He agrees to plan of care.  Final Clinical Impressions(s) / UC Diagnoses   Final diagnoses:  Elevated blood pressure reading in office with diagnosis of hypertension  Ulcer of lower lip  Medication refill     Discharge Instructions      Your blood pressure is elevated today at 180/79.  Please have this rechecked by your current primary care provider this week.  A 30-day supply of your blood pressure medicine has been sent to your pharmacy.  Please have your current primary care provider provide additional refills.    We have scheduled an appointment for you to establish a new PCP here in [place].  See appointment date and time listed below.  Use Abreva cream as directed for the lip sore.          ED Prescriptions     Medication Sig Dispense Auth. Provider   losartan -hydrochlorothiazide (HYZAAR) 100-12.5 MG tablet Take 1 tablet by mouth daily. 30  tablet Corlis Burnard DEL, NP   Docosanol (ABREVA) 10 % CREA Apply 1 Application topically 5 (five) times daily. 2 g Corlis Burnard DEL, NP      PDMP not reviewed this encounter.   Corlis Burnard DEL, NP 08/29/24 [phone]

## 2024-10-12 ENCOUNTER — Other Ambulatory Visit: Payer: Self-pay | Admitting: Nurse Practitioner

## 2024-10-12 DIAGNOSIS — M4802 Spinal stenosis, cervical region: Secondary | ICD-10-CM

## 2024-10-12 DIAGNOSIS — M19012 Primary osteoarthritis, left shoulder: Secondary | ICD-10-CM

## 2024-10-12 DIAGNOSIS — E781 Pure hyperglyceridemia: Secondary | ICD-10-CM

## 2024-10-12 DIAGNOSIS — M1712 Unilateral primary osteoarthritis, left knee: Secondary | ICD-10-CM

## 2024-10-12 DIAGNOSIS — I10 Essential (primary) hypertension: Secondary | ICD-10-CM

## 2024-10-12 DIAGNOSIS — F19982 Other psychoactive substance use, unspecified with psychoactive substance-induced sleep disorder: Secondary | ICD-10-CM

## 2024-10-23 ENCOUNTER — Ambulatory Visit: Admitting: Family Medicine
